# Patient Record
Sex: Male | Born: 1960
Health system: Southern US, Community
[De-identification: ages and names within clinical notes are randomized; demographics above are authoritative.]

## PROBLEM LIST (undated history)

## (undated) DIAGNOSIS — K219 Gastro-esophageal reflux disease without esophagitis: Secondary | ICD-10-CM

## (undated) DIAGNOSIS — I10 Essential (primary) hypertension: Secondary | ICD-10-CM

## (undated) DIAGNOSIS — E785 Hyperlipidemia, unspecified: Secondary | ICD-10-CM

## (undated) HISTORY — DX: Gastro-esophageal reflux disease without esophagitis: K21.9

## (undated) HISTORY — DX: Essential (primary) hypertension: I10

## (undated) HISTORY — DX: Hyperlipidemia, unspecified: E78.5

---

## 2003-12-27 ENCOUNTER — Ambulatory Visit: Payer: Self-pay | Admitting: Gastroenterology

## 2011-11-16 ENCOUNTER — Ambulatory Visit: Payer: Self-pay | Admitting: Family Medicine

## 2013-11-21 ENCOUNTER — Ambulatory Visit: Payer: Self-pay | Admitting: Family Medicine

## 2014-04-22 ENCOUNTER — Ambulatory Visit
Admit: 2014-04-22 | Disposition: A | Discharge status: Home / Self Care | Payer: Self-pay | Attending: Family Medicine | Admitting: Family Medicine

## 2014-09-09 ENCOUNTER — Encounter: Payer: Self-pay | Admitting: Family Medicine

## 2014-09-09 ENCOUNTER — Ambulatory Visit (INDEPENDENT_AMBULATORY_CARE_PROVIDER_SITE_OTHER): Payer: BLUE CROSS/BLUE SHIELD | Admitting: Family Medicine

## 2014-09-09 VITALS — BP 122/80 | HR 79 | Temp 97.5°F | Resp 19 | Ht 67.0 in | Wt 180.2 lb

## 2014-09-09 DIAGNOSIS — K219 Gastro-esophageal reflux disease without esophagitis: Secondary | ICD-10-CM | POA: Diagnosis not present

## 2014-09-09 DIAGNOSIS — I10 Essential (primary) hypertension: Secondary | ICD-10-CM | POA: Diagnosis not present

## 2014-09-09 DIAGNOSIS — E785 Hyperlipidemia, unspecified: Secondary | ICD-10-CM | POA: Diagnosis not present

## 2014-09-09 MED ORDER — CRESTOR 40 MG PO TABS: 40.0000 mg | ORAL_TABLET | Freq: Every day | ORAL | Status: DC

## 2014-09-09 MED ORDER — LANSOPRAZOLE 30 MG PO CPDR: 30.0000 mg | DELAYED_RELEASE_CAPSULE | Freq: Every day | ORAL | Status: DC

## 2014-09-09 MED ORDER — AMLODIPINE BESYLATE 2.5 MG PO TABS: 2.5000 mg | ORAL_TABLET | Freq: Every day | ORAL | Status: DC

## 2014-09-09 NOTE — Progress Notes (Signed)
Name: Martin Cook   MRN: [id number]    DOB: Apr 05, 1960   Date:09/09/2014       Progress Note  Subjective  Chief Complaint  Chief Complaint  Patient presents with  . Medication Refill    amlodipine 2.5mg  / crestor 40mg  / lansoprazole 30mg   . Gastrophageal Reflux  . Hyperlipidemia  . Hypertension    Gastrophageal Reflux He reports no abdominal pain, no chest pain, no early satiety, no heartburn or no nausea. He has tried a PPI for the symptoms. The treatment provided significant relief. Past procedures include an EGD. Past procedures do not include an abdominal ultrasound.  Hyperlipidemia The problem is controlled. Pertinent negatives include no chest pain, leg pain, myalgias or shortness of breath. Current antihyperlipidemic treatment includes statins.  Hypertension This is a chronic problem. The problem is controlled. Pertinent negatives include no chest pain, headaches, orthopnea, palpitations or shortness of breath. Past treatments include calcium channel blockers. There is no history of kidney disease, CAD/MI or CVA.      Past Medical History  Diagnosis Date  . Hyperlipidemia   . Hypertension   . GERD (gastroesophageal reflux disease)     History reviewed. No pertinent past surgical history.  History reviewed. No pertinent family history.  Social History   Social History  . Marital Status: Married    Spouse Name: N/A  . Number of Children: N/A  . Years of Education: N/A   Occupational History  . Not on file.   Social History Main Topics  . Smoking status: Current Every Day Smoker    Types: Cigarettes  . Smokeless tobacco: Never Used  . Alcohol Use: No     Comment: occasional  . Drug Use: No  . Sexual Activity: Not on file   Other Topics Concern  . Not on file   Social History Narrative  . No narrative on file     Current outpatient prescriptions:  .  amLODipine (NORVASC) 2.5 MG tablet, Take 2.5 mg by mouth daily., Disp: , Rfl: 2 .  CRESTOR 40 MG  tablet, Take 40 mg by mouth at bedtime., Disp: , Rfl: 0 .  lansoprazole (PREVACID) 30 MG capsule, Take 30 mg by mouth daily., Disp: , Rfl: 0  No Known Allergies   Review of Systems  Respiratory: Negative for shortness of breath.   Cardiovascular: Negative for chest pain, palpitations and orthopnea.  Gastrointestinal: Negative for heartburn, nausea and abdominal pain.  Musculoskeletal: Negative for myalgias.  Neurological: Negative for headaches.      Objective  Filed Vitals:   09/09/14 0927  BP: 122/80  Pulse: 79  Temp: 97.5 F (36.4 C)  TempSrc: Oral  Resp: 19  Height: 5\' 7"  (1.702 m)  Weight: 180 lb 3.2 oz (81.738 kg)  SpO2: 96%    Physical Exam  Constitutional: He is oriented to person, place, and time and well-developed, well-nourished, and in no distress.  Cardiovascular: Normal rate and regular rhythm.   Pulmonary/Chest: Effort normal and breath sounds normal.  Abdominal: Soft. Bowel sounds are normal.  Musculoskeletal: He exhibits no edema.  Neurological: He is alert and oriented to person, place, and time.  Skin: Skin is warm and dry.  Nursing note and vitals reviewed.   Assessment & Plan  1. Essential hypertension  - amLODipine (NORVASC) 2.5 MG tablet; Take 1 tablet (2.5 mg total) by mouth daily.  Dispense: 90 tablet; Refill: 1  2. Gastroesophageal reflux disease, esophagitis presence not specified  - lansoprazole (PREVACID) 30 MG capsule; Take  1 capsule (30 mg total) by mouth daily.  Dispense: 90 capsule; Refill: 1  3. Hyperlipidemia  - CRESTOR 40 MG tablet; Take 1 tablet (40 mg total) by mouth at bedtime.  Dispense: 90 tablet; Refill: 1   Martin Cook [facility] 09/09/2014 9:58 AM

## 2014-12-10 ENCOUNTER — Encounter: Payer: Self-pay | Admitting: Family Medicine

## 2014-12-10 ENCOUNTER — Ambulatory Visit (INDEPENDENT_AMBULATORY_CARE_PROVIDER_SITE_OTHER): Payer: BLUE CROSS/BLUE SHIELD | Admitting: Family Medicine

## 2014-12-10 VITALS — BP 122/80 | HR 84 | Temp 98.1°F | Resp 17 | Ht 67.0 in | Wt 186.9 lb

## 2014-12-10 DIAGNOSIS — Z720 Tobacco use: Secondary | ICD-10-CM | POA: Insufficient documentation

## 2014-12-10 DIAGNOSIS — K219 Gastro-esophageal reflux disease without esophagitis: Secondary | ICD-10-CM | POA: Diagnosis not present

## 2014-12-10 DIAGNOSIS — E785 Hyperlipidemia, unspecified: Secondary | ICD-10-CM | POA: Diagnosis not present

## 2014-12-10 DIAGNOSIS — I10 Essential (primary) hypertension: Secondary | ICD-10-CM | POA: Diagnosis not present

## 2014-12-10 MED ORDER — CRESTOR 40 MG PO TABS: 40.0000 mg | ORAL_TABLET | Freq: Every day | ORAL | Status: DC

## 2014-12-10 MED ORDER — LANSOPRAZOLE 30 MG PO CPDR: 30.0000 mg | DELAYED_RELEASE_CAPSULE | Freq: Every day | ORAL | Status: DC

## 2014-12-10 MED ORDER — AMLODIPINE BESYLATE 2.5 MG PO TABS: 2.5000 mg | ORAL_TABLET | Freq: Every day | ORAL | Status: DC

## 2014-12-10 NOTE — Progress Notes (Signed)
Name: Martin Cook   MRN: [id number]    DOB: [birth year]   Date:12/10/2014       Progress Note  Subjective  Chief Complaint  Chief Complaint  Patient presents with  . Follow-up    3 mo  . Labs Only    fasting  . Hypertension  . Hyperlipidemia  . Gastroesophageal Reflux    Hypertension This is a chronic problem. The problem is controlled. Pertinent negatives include no chest pain, headaches, palpitations or shortness of breath. Past treatments include calcium channel blockers.  Hyperlipidemia This is a chronic problem. The problem is controlled. Pertinent negatives include no chest pain, leg pain, myalgias or shortness of breath. Current antihyperlipidemic treatment includes statins.  Gastroesophageal Reflux He reports no chest pain, no dysphagia, no heartburn, no nausea or no sore throat. This is a chronic problem. The problem has been unchanged. The symptoms are aggravated by caffeine. He has tried a PPI for the symptoms.    Past Medical History  Diagnosis Date  . Hyperlipidemia   . Hypertension   . GERD (gastroesophageal reflux disease)     History reviewed. No pertinent past surgical history.  History reviewed. No pertinent family history.  Social History   Social History  . Marital Status: Married    Spouse Name: N/A  . Number of Children: N/A  . Years of Education: N/A   Occupational History  . Not on file.   Social History Main Topics  . Smoking status: Current Every Day Smoker    Types: Cigarettes  . Smokeless tobacco: Never Used  . Alcohol Use: No     Comment: occasional  . Drug Use: No  . Sexual Activity: Not on file   Other Topics Concern  . Not on file   Social History Narrative     Current outpatient prescriptions:  .  amLODipine (NORVASC) 2.5 MG tablet, Take 1 tablet (2.5 mg total) by mouth daily., Disp: 90 tablet, Rfl: 1 .  CRESTOR 40 MG tablet, Take 1 tablet (40 mg total) by mouth at bedtime., Disp: 90 tablet, Rfl: 1 .  lansoprazole  (PREVACID) 30 MG capsule, Take 1 capsule (30 mg total) by mouth daily., Disp: 90 capsule, Rfl: 1  No Known Allergies   Review of Systems  HENT: Negative for sore throat.   Respiratory: Negative for shortness of breath.   Cardiovascular: Negative for chest pain and palpitations.  Gastrointestinal: Negative for heartburn, dysphagia and nausea.  Musculoskeletal: Negative for myalgias.  Neurological: Negative for headaches.     Objective  Filed Vitals:   12/10/14 0816  BP: 122/80  Pulse: 84  Temp: 98.1 F (36.7 C)  TempSrc: Oral  Resp: 17  Height: 5\' 7"  (1.702 m)  Weight: 186 lb 14.4 oz (84.777 kg)  SpO2: 97%    Physical Exam  Constitutional: He is oriented to person, place, and time and well-developed, well-nourished, and in no distress.  HENT:  Head: Normocephalic and atraumatic.  Eyes: Pupils are equal, round, and reactive to light.  Cardiovascular: Normal rate, regular rhythm and normal heart sounds.   No murmur heard. Pulmonary/Chest: Effort normal and breath sounds normal. He has no wheezes.  Abdominal: Soft. Bowel sounds are normal.  Musculoskeletal: Normal range of motion.  Neurological: He is alert and oriented to person, place, and time.  Skin: Skin is warm and dry.  Psychiatric: Mood, memory, affect and judgment normal.  Nursing note and vitals reviewed.   Assessment & Plan  1. Essential hypertension BP is stable on  current therapy - amLODipine (NORVASC) 2.5 MG tablet; Take 1 tablet (2.5 mg total) by mouth daily.  Dispense: 90 tablet; Refill: 1  2. Gastroesophageal reflux disease, esophagitis presence not specified Symptoms of heartburn and reflux controlled on current PPI therapy. - lansoprazole (PREVACID) 30 MG capsule; Take 1 capsule (30 mg total) by mouth daily.  Dispense: 90 capsule; Refill: 1  3. Hyperlipidemia Recheck FLP today and follow-up - CRESTOR 40 MG tablet; Take 1 tablet (40 mg total) by mouth at bedtime.  Dispense: 90 tablet; Refill:  1 - Lipid Profile - Comprehensive Metabolic Panel (CMET)   Eyal Greenhaw [facility] 12/10/2014 8:27 AM

## 2014-12-11 LAB — COMPREHENSIVE METABOLIC PANEL
ALBUMIN: 4.5 g/dL (ref 3.5–5.5)
ALK PHOS: 78 IU/L (ref 39–117)
ALT: 21 IU/L (ref 0–44)
AST: 20 IU/L (ref 0–40)
Albumin/Globulin Ratio: 2 (ref 1.1–2.5)
BILIRUBIN TOTAL: 0.9 mg/dL (ref 0.0–1.2)
BUN / CREAT RATIO: 11 (ref 9–20)
BUN: 10 mg/dL (ref 6–24)
CO2: 24 mmol/L (ref 18–29)
CREATININE: 0.9 mg/dL (ref 0.76–1.27)
Calcium: 9.7 mg/dL (ref 8.7–10.2)
Chloride: 100 mmol/L (ref 97–106)
GFR calc non Af Amer: 96 mL/min/{1.73_m2} (ref >59)
GFR, EST AFRICAN AMERICAN: 111 mL/min/{1.73_m2} (ref >59)
GLUCOSE: 99 mg/dL (ref 65–99)
Globulin, Total: 2.3 g/dL (ref 1.5–4.5)
Potassium: 4.3 mmol/L (ref 3.5–5.2)
Sodium: 142 mmol/L (ref 136–144)
Total Protein: 6.8 g/dL (ref 6.0–8.5)

## 2014-12-11 LAB — LIPID PANEL
CHOL/HDL RATIO: 2.8 ratio (ref 0.0–5.0)
Cholesterol, Total: 182 mg/dL (ref 100–199)
HDL: 64 mg/dL (ref >39)
LDL Calculated: 74 mg/dL (ref 0–99)
Triglycerides: 219 mg/dL — ABNORMAL HIGH (ref 0–149)
VLDL CHOLESTEROL CAL: 44 mg/dL — AB (ref 5–40)

## 2015-03-06 ENCOUNTER — Other Ambulatory Visit: Payer: Self-pay | Admitting: Family Medicine

## 2015-06-04 ENCOUNTER — Other Ambulatory Visit: Payer: Self-pay | Admitting: Family Medicine

## 2015-06-09 ENCOUNTER — Ambulatory Visit (INDEPENDENT_AMBULATORY_CARE_PROVIDER_SITE_OTHER): Payer: BLUE CROSS/BLUE SHIELD | Admitting: Family Medicine

## 2015-06-09 ENCOUNTER — Encounter: Payer: Self-pay | Admitting: Family Medicine

## 2015-06-09 VITALS — BP 120/93 | HR 80 | Temp 98.0°F | Resp 16 | Ht 67.0 in | Wt 189.8 lb

## 2015-06-09 DIAGNOSIS — K219 Gastro-esophageal reflux disease without esophagitis: Secondary | ICD-10-CM

## 2015-06-09 DIAGNOSIS — I10 Essential (primary) hypertension: Secondary | ICD-10-CM | POA: Diagnosis not present

## 2015-06-09 DIAGNOSIS — E785 Hyperlipidemia, unspecified: Secondary | ICD-10-CM | POA: Diagnosis not present

## 2015-06-09 MED ORDER — LANSOPRAZOLE 30 MG PO CPDR: 30.0000 mg | DELAYED_RELEASE_CAPSULE | Freq: Every day | ORAL | Status: DC

## 2015-06-09 NOTE — Progress Notes (Signed)
Name: Martin Cook   MRN: [id number]    DOB: Jan 22, 1960   Date:06/09/2015       Progress Note  Subjective  Chief Complaint  Chief Complaint  Patient presents with  . Follow-up    6 mo  . Medication Refill    lansoprazole 30 mg     Hypertension This is a chronic problem. The problem is unchanged. The problem is controlled. Associated symptoms include sweats (sometimes wakes up sweating.). Pertinent negatives include no blurred vision, chest pain, headaches, palpitations or shortness of breath. Past treatments include calcium channel blockers.  Hyperlipidemia This is a chronic problem. Recent lipid tests were reviewed and are high. Pertinent negatives include no chest pain, myalgias or shortness of breath. Current antihyperlipidemic treatment includes statins.  Gastroesophageal Reflux He reports no abdominal pain, no chest pain, no heartburn or no nausea. This is a chronic problem. The symptoms are aggravated by certain foods (chocolate, sodas, etc.). He has tried a PPI for the symptoms.     Past Medical History  Diagnosis Date  . Hyperlipidemia   . Hypertension   . GERD (gastroesophageal reflux disease)     History reviewed. No pertinent past surgical history.  History reviewed. No pertinent family history.  Social History   Social History  . Marital Status: Married    Spouse Name: N/A  . Number of Children: N/A  . Years of Education: N/A   Occupational History  . Not on file.   Social History Main Topics  . Smoking status: Current Every Day Smoker    Types: Cigarettes  . Smokeless tobacco: Never Used  . Alcohol Use: No     Comment: occasional  . Drug Use: No  . Sexual Activity: Not on file   Other Topics Concern  . Not on file   Social History Narrative     Current outpatient prescriptions:  .  amLODipine (NORVASC) 2.5 MG tablet, TAKE 1 TABLET (2.5 MG TOTAL) BY MOUTH DAILY., Disp: 90 tablet, Rfl: 1 .  CRESTOR 40 MG tablet, Take 1 tablet (40 mg total) by  mouth at bedtime., Disp: 90 tablet, Rfl: 1 .  CRESTOR 40 MG tablet, TAKE 1 TABLET (40 MG TOTAL) BY MOUTH AT BEDTIME., Disp: 90 tablet, Rfl: 1 .  lansoprazole (PREVACID) 30 MG capsule, Take 1 capsule (30 mg total) by mouth daily., Disp: 90 capsule, Rfl: 1  No Known Allergies   Review of Systems  Constitutional: Negative for fever and chills.  Eyes: Negative for blurred vision.  Respiratory: Negative for shortness of breath.   Cardiovascular: Negative for chest pain and palpitations.  Gastrointestinal: Negative for heartburn, nausea, vomiting and abdominal pain.  Musculoskeletal: Negative for myalgias.  Neurological: Negative for headaches.    Objective  Filed Vitals:   06/09/15 0809  BP: 120/93  Pulse: 80  Temp: 98 F (36.7 C)  TempSrc: Oral  Resp: 16  Height: 5\' 7"  (1.702 m)  Weight: 189 lb 12.8 oz (86.093 kg)  SpO2: 96%    Physical Exam  Constitutional: He is oriented to person, place, and time and well-developed, well-nourished, and in no distress.  HENT:  Head: Normocephalic and atraumatic.  Eyes: Pupils are equal, round, and reactive to light.  Cardiovascular: Normal rate and regular rhythm.   Pulmonary/Chest: Effort normal and breath sounds normal.  Abdominal: Soft. Bowel sounds are normal.  Neurological: He is alert and oriented to person, place, and time.  Psychiatric: Mood, memory, affect and judgment normal.  Nursing note and vitals reviewed.  Assessment & Plan  1. Gastroesophageal reflux disease, esophagitis presence not specified Stable on PPI - lansoprazole (PREVACID) 30 MG capsule; Take 1 capsule (30 mg total) by mouth daily.  Dispense: 90 capsule; Refill: 1  2. Hyperlipidemia Elevated triglycerides, repeat FLP and consider triglyceride lowering therapy - Lipid Profile - Comprehensive Metabolic Panel (CMET)  3. Essential hypertension D BP elevated, patient reports BP usually higher when coming in for doctor's appointments. Continue on amlodipine  and follow-up.   Jovana Rembold [facility] 06/09/2015 8:24 AM

## 2015-06-10 LAB — COMPREHENSIVE METABOLIC PANEL
ALK PHOS: 80 IU/L (ref 39–117)
ALT: 24 IU/L (ref 0–44)
AST: 23 IU/L (ref 0–40)
Albumin/Globulin Ratio: 2.2 (ref 1.2–2.2)
Albumin: 4.6 g/dL (ref 3.5–5.5)
BILIRUBIN TOTAL: 0.8 mg/dL (ref 0.0–1.2)
BUN/Creatinine Ratio: 9 (ref 9–20)
BUN: 9 mg/dL (ref 6–24)
CHLORIDE: 99 mmol/L (ref 96–106)
CO2: 26 mmol/L (ref 18–29)
Calcium: 9.6 mg/dL (ref 8.7–10.2)
Creatinine, Ser: 0.97 mg/dL (ref 0.76–1.27)
GFR calc Af Amer: 101 mL/min/{1.73_m2} (ref >59)
GFR calc non Af Amer: 88 mL/min/{1.73_m2} (ref >59)
GLUCOSE: 106 mg/dL — AB (ref 65–99)
Globulin, Total: 2.1 g/dL (ref 1.5–4.5)
Potassium: 4.8 mmol/L (ref 3.5–5.2)
Sodium: 141 mmol/L (ref 134–144)
Total Protein: 6.7 g/dL (ref 6.0–8.5)

## 2015-06-10 LAB — LIPID PANEL
CHOL/HDL RATIO: 3.2 ratio (ref 0.0–5.0)
Cholesterol, Total: 185 mg/dL (ref 100–199)
HDL: 57 mg/dL (ref >39)
LDL CALC: 84 mg/dL (ref 0–99)
Triglycerides: 218 mg/dL — ABNORMAL HIGH (ref 0–149)
VLDL CHOLESTEROL CAL: 44 mg/dL — AB (ref 5–40)

## 2015-12-02 ENCOUNTER — Telehealth: Payer: Self-pay | Admitting: Family Medicine

## 2015-12-02 ENCOUNTER — Other Ambulatory Visit: Payer: Self-pay | Admitting: Family Medicine

## 2015-12-02 DIAGNOSIS — I10 Essential (primary) hypertension: Secondary | ICD-10-CM

## 2015-12-02 DIAGNOSIS — K219 Gastro-esophageal reflux disease without esophagitis: Secondary | ICD-10-CM

## 2015-12-02 MED ORDER — AMLODIPINE BESYLATE 2.5 MG PO TABS: ORAL_TABLET | ORAL | 0 refills | Status: DC

## 2015-12-02 NOTE — Telephone Encounter (Signed)
Pt [facility]. Pt has an appt on 12/15/15.

## 2015-12-02 NOTE — Telephone Encounter (Signed)
Prescription for amlodipine has been sent to patient's pharmacy

## 2015-12-03 NOTE — Telephone Encounter (Signed)
Pt informed

## 2015-12-15 ENCOUNTER — Ambulatory Visit (INDEPENDENT_AMBULATORY_CARE_PROVIDER_SITE_OTHER): Payer: BLUE CROSS/BLUE SHIELD | Admitting: Family Medicine

## 2015-12-15 ENCOUNTER — Encounter: Payer: Self-pay | Admitting: Family Medicine

## 2015-12-15 DIAGNOSIS — E785 Hyperlipidemia, unspecified: Secondary | ICD-10-CM

## 2015-12-15 DIAGNOSIS — R739 Hyperglycemia, unspecified: Secondary | ICD-10-CM

## 2015-12-15 DIAGNOSIS — K219 Gastro-esophageal reflux disease without esophagitis: Secondary | ICD-10-CM

## 2015-12-15 DIAGNOSIS — I10 Essential (primary) hypertension: Secondary | ICD-10-CM

## 2015-12-15 LAB — COMPLETE METABOLIC PANEL WITH GFR
ALT: 30 U/L (ref 9–46)
AST: 30 U/L (ref 10–35)
Albumin: 4.7 g/dL (ref 3.6–5.1)
Alkaline Phosphatase: 72 U/L (ref 40–115)
BUN: 12 mg/dL (ref 7–25)
CALCIUM: 9.5 mg/dL (ref 8.6–10.3)
CHLORIDE: 105 mmol/L (ref 98–110)
CO2: 30 mmol/L (ref 20–31)
CREATININE: 0.88 mg/dL (ref 0.70–1.33)
Glucose, Bld: 103 mg/dL — ABNORMAL HIGH (ref 65–99)
Potassium: 4.4 mmol/L (ref 3.5–5.3)
Sodium: 143 mmol/L (ref 135–146)
Total Bilirubin: 1.1 mg/dL (ref 0.2–1.2)
Total Protein: 6.9 g/dL (ref 6.1–8.1)

## 2015-12-15 LAB — LIPID PANEL
CHOL/HDL RATIO: 3 ratio (ref <5.0)
CHOLESTEROL: 203 mg/dL — AB (ref <200)
HDL: 67 mg/dL (ref >40)
LDL CALC: 87 mg/dL (ref <100)
TRIGLYCERIDES: 246 mg/dL — AB (ref <150)
VLDL: 49 mg/dL — AB (ref <30)

## 2015-12-15 LAB — POCT GLYCOSYLATED HEMOGLOBIN (HGB A1C): HEMOGLOBIN A1C: 6.1

## 2015-12-15 MED ORDER — LANSOPRAZOLE 30 MG PO CPDR: 30.0000 mg | DELAYED_RELEASE_CAPSULE | Freq: Every day | ORAL | 1 refills | Status: DC

## 2015-12-15 MED ORDER — AMLODIPINE BESYLATE 2.5 MG PO TABS: ORAL_TABLET | ORAL | 0 refills | Status: DC

## 2015-12-15 MED ORDER — CRESTOR 40 MG PO TABS: 40.0000 mg | ORAL_TABLET | Freq: Every day | ORAL | 1 refills | Status: DC

## 2015-12-15 NOTE — Progress Notes (Signed)
Name: Martin Cook   MRN: [id number]    DOB: 03/18/1960   Date:12/15/2015       Progress Note  Subjective  Chief Complaint  Chief Complaint  Patient presents with  . Follow-up    6 mo  . Medication Refill    Hypertension  This is a chronic problem. The problem is unchanged. The problem is controlled. Pertinent negatives include no blurred vision, chest pain, headaches, palpitations, shortness of breath or sweats. Past treatments include calcium channel blockers.  Hyperlipidemia  This is a chronic problem. The problem is uncontrolled. Recent lipid tests were reviewed and are high (elevated triglycerides). Pertinent negatives include no chest pain, myalgias or shortness of breath. Current antihyperlipidemic treatment includes statins.  Gastroesophageal Reflux  He reports no abdominal pain, no chest pain, no heartburn or no nausea. This is a chronic problem. The symptoms are aggravated by certain foods (chocolate, sodas, etc.). He has tried a PPI for the symptoms.     Past Medical History:  Diagnosis Date  . GERD (gastroesophageal reflux disease)   . Hyperlipidemia   . Hypertension     History reviewed. No pertinent surgical history.  History reviewed. No pertinent family history.  Social History   Social History  . Marital status: Married    Spouse name: N/A  . Number of children: N/A  . Years of education: N/A   Occupational History  . Not on file.   Social History Main Topics  . Smoking status: Current Every Day Smoker    Types: Cigarettes  . Smokeless tobacco: Never Used  . Alcohol use No     Comment: occasional  . Drug use: No  . Sexual activity: Not on file   Other Topics Concern  . Not on file   Social History Narrative  . No narrative on file     Current Outpatient Prescriptions:  .  amLODipine (NORVASC) 2.5 MG tablet, TAKE 1 TABLET (2.5 MG TOTAL) BY MOUTH DAILY., Disp: 90 tablet, Rfl: 0 .  CRESTOR 40 MG tablet, Take 1 tablet (40 mg total) by  mouth at bedtime., Disp: 90 tablet, Rfl: 1 .  lansoprazole (PREVACID) 30 MG capsule, Take 1 capsule (30 mg total) by mouth daily., Disp: 90 capsule, Rfl: 1  No Known Allergies   Review of Systems  Eyes: Negative for blurred vision.  Respiratory: Negative for shortness of breath.   Cardiovascular: Negative for chest pain and palpitations.  Gastrointestinal: Negative for abdominal pain, heartburn and nausea.  Musculoskeletal: Negative for myalgias.  Neurological: Negative for headaches.      Objective  Vitals:   12/15/15 0835  BP: 123/77  Pulse: 83  Resp: 17  Temp: 97.9 F (36.6 C)  TempSrc: Oral  SpO2: 96%  Weight: 191 lb 4.8 oz (86.8 kg)  Height: 5\' 7"  (1.702 m)    Physical Exam  Constitutional: He is oriented to person, place, and time and well-developed, well-nourished, and in no distress.  HENT:  Head: Normocephalic and atraumatic.  Eyes: Pupils are equal, round, and reactive to light.  Cardiovascular: Normal rate, regular rhythm and normal heart sounds.   No murmur heard. Pulmonary/Chest: Effort normal and breath sounds normal. He has no wheezes.  Abdominal: Soft. Bowel sounds are normal. There is no tenderness.  Neurological: He is alert and oriented to person, place, and time.  Psychiatric: Mood, memory, affect and judgment normal.  Nursing note and vitals reviewed.      No results found for this or any previous visit (from  the past 2160 hour(s)).   Assessment & Plan  1. Essential hypertension  - amLODipine (NORVASC) 2.5 MG tablet; TAKE 1 TABLET (2.5 MG TOTAL) BY MOUTH DAILY.  Dispense: 90 tablet; Refill: 0  2. Gastroesophageal reflux disease, esophagitis presence not specified  - lansoprazole (PREVACID) 30 MG capsule; Take 1 capsule (30 mg total) by mouth daily.  Dispense: 90 capsule; Refill: 1  3. Hyperlipidemia, unspecified hyperlipidemia type  - CRESTOR 40 MG tablet; Take 1 tablet (40 mg total) by mouth at bedtime.  Dispense: 90 tablet;  Refill: 1 - Lipid Profile - COMPLETE METABOLIC PANEL WITH GFR  4. Hyperglycemia A1c 6.1%, consistent with prediabetes - POCT HgB A1C   Mehki Klumpp Asad A. [facility]lene [facility] 12/15/2015 8:42 AM

## 2016-02-06 ENCOUNTER — Ambulatory Visit (INDEPENDENT_AMBULATORY_CARE_PROVIDER_SITE_OTHER): Payer: BLUE CROSS/BLUE SHIELD | Admitting: Family Medicine

## 2016-02-06 ENCOUNTER — Encounter: Payer: BLUE CROSS/BLUE SHIELD | Admitting: Family Medicine

## 2016-02-06 ENCOUNTER — Encounter: Payer: Self-pay | Admitting: Family Medicine

## 2016-02-06 DIAGNOSIS — Z1211 Encounter for screening for malignant neoplasm of colon: Secondary | ICD-10-CM

## 2016-02-06 DIAGNOSIS — Z0000 Encounter for general adult medical examination without abnormal findings: Secondary | ICD-10-CM

## 2016-02-06 LAB — PSA: PSA: 0.3 ng/mL (ref <=4.0)

## 2016-02-06 LAB — CBC WITH DIFFERENTIAL/PLATELET
BASOS ABS: 55 {cells}/uL (ref 0–200)
Basophils Relative: 1 %
Eosinophils Absolute: 55 cells/uL (ref 15–500)
Eosinophils Relative: 1 %
HEMATOCRIT: 46.7 % (ref 38.5–50.0)
HEMOGLOBIN: 15.8 g/dL (ref 13.2–17.1)
LYMPHS ABS: 1595 {cells}/uL (ref 850–3900)
Lymphocytes Relative: 29 %
MCH: 30.3 pg (ref 27.0–33.0)
MCHC: 33.8 g/dL (ref 32.0–36.0)
MCV: 89.6 fL (ref 80.0–100.0)
MONO ABS: 440 {cells}/uL (ref 200–950)
MPV: 9.8 fL (ref 7.5–12.5)
Monocytes Relative: 8 %
NEUTROS PCT: 61 %
Neutro Abs: 3355 cells/uL (ref 1500–7800)
Platelets: 241 10*3/uL (ref 140–400)
RBC: 5.21 MIL/uL (ref 4.20–5.80)
RDW: 14.6 % (ref 11.0–15.0)
WBC: 5.5 10*3/uL (ref 3.8–10.8)

## 2016-02-06 LAB — TSH: TSH: 1.31 mIU/L (ref 0.40–4.50)

## 2016-02-06 NOTE — Progress Notes (Signed)
Name: Martin Cook   MRN: [id number]    DOB: 1960-03-26   Date:02/06/2016       Progress Note  Subjective  Chief Complaint  Chief Complaint  Patient presents with  . Annual Exam    CPE    HPI  Pt. Presents for a Complete Physical Exam He never had a colonoscopy, does not want to have one. Had a prostate exam by last PCP, no PSA on file.   Past Medical History:  Diagnosis Date  . GERD (gastroesophageal reflux disease)   . Hyperlipidemia   . Hypertension     History reviewed. No pertinent surgical history.  Family History  Problem Relation Age of Onset  . Stroke Mother   . Stroke Father   . Deep vein thrombosis Father   . Cancer Brother     Social History   Social History  . Marital status: Married    Spouse name: N/A  . Number of children: N/A  . Years of education: N/A   Occupational History  . Not on file.   Social History Main Topics  . Smoking status: Current Every Day Smoker    Packs/day: 1.00    Types: Cigarettes  . Smokeless tobacco: Never Used  . Alcohol use No     Comment: occasional  . Drug use: No  . Sexual activity: Yes   Other Topics Concern  . Not on file   Social History Narrative  . No narrative on file     Current Outpatient Prescriptions:  .  amLODipine (NORVASC) 2.5 MG tablet, TAKE 1 TABLET (2.5 MG TOTAL) BY MOUTH DAILY., Disp: 90 tablet, Rfl: 0 .  CRESTOR 40 MG tablet, Take 1 tablet (40 mg total) by mouth at bedtime., Disp: 90 tablet, Rfl: 1 .  lansoprazole (PREVACID) 30 MG capsule, Take 1 capsule (30 mg total) by mouth daily., Disp: 90 capsule, Rfl: 1  No Known Allergies   Review of Systems  Constitutional: Negative for chills, fever, malaise/fatigue and weight loss.  HENT: Positive for congestion (sinus congestion from gas heating) and sinus pain. Negative for sore throat.   Eyes: Negative for blurred vision and double vision.  Respiratory: Positive for cough. Negative for sputum production and shortness of breath.    Cardiovascular: Negative for chest pain and leg swelling.  Gastrointestinal: Negative for abdominal pain, blood in stool, nausea and vomiting.  Genitourinary: Negative for dysuria and hematuria.  Musculoskeletal: Negative for back pain (occasional low back pain), joint pain and neck pain.  Skin: Negative for rash.  Neurological: Negative for dizziness and headaches (occasional mild headache).  Psychiatric/Behavioral: Negative for depression and hallucinations. The patient is nervous/anxious. The patient does not have insomnia.     Objective  Vitals:   02/06/16 1147  BP: 130/75  Pulse: 82  Resp: 16  Temp: 97.9 F (36.6 C)  TempSrc: Oral  SpO2: 96%  Weight: 191 lb 11.2 oz (87 kg)  Height: 5\' 7"  (1.702 m)    Physical Exam  Constitutional: He is oriented to person, place, and time and well-developed, well-nourished, and in no distress.  HENT:  Head: Normocephalic and atraumatic.  Right Ear: External ear normal.  Left Ear: External ear normal.  Mouth/Throat: No oropharyngeal exudate.  Eyes: Conjunctivae are normal. Pupils are equal, round, and reactive to light.  Neck: Neck supple.  Cardiovascular: Normal rate, regular rhythm and normal heart sounds.   No murmur heard. Pulmonary/Chest: Effort normal and breath sounds normal. He has no wheezes.  Abdominal: Soft. Bowel  sounds are normal. There is no tenderness.  Musculoskeletal: Normal range of motion. He exhibits no edema.  Neurological: He is alert and oriented to person, place, and time.  Psychiatric: Mood, memory, affect and judgment normal.  Nursing note and vitals reviewed.       Assessment & Plan  1. Annual physical exam Obtain age-appropriate lab work. - CBC with Differential - TSH - Vitamin D (25 hydroxy) - PSA  2. Colon cancer screening  - Cologuard   Martin Cook Asad A. Faylene [facility] 02/06/2016 12:27 PM

## 2016-02-07 LAB — VITAMIN D 25 HYDROXY (VIT D DEFICIENCY, FRACTURES): Vit D, 25-Hydroxy: 13 ng/mL — ABNORMAL LOW (ref 30–100)

## 2016-02-11 ENCOUNTER — Telehealth: Payer: Self-pay

## 2016-02-11 MED ORDER — VITAMIN D (ERGOCALCIFEROL) 1.25 MG (50000 UNIT) PO CAPS: 50000.0000 [IU] | ORAL_CAPSULE | ORAL | 0 refills | Status: DC

## 2016-02-11 NOTE — Telephone Encounter (Signed)
Patient has been notified of lab results and a prescription for vitamin D3 50,000 units take 1 capsule once a week x12 weeks has been sent to [facility] per Dr. Shah, patient has been notified  

## 2016-03-01 ENCOUNTER — Other Ambulatory Visit: Payer: Self-pay | Admitting: Family Medicine

## 2016-03-01 DIAGNOSIS — E785 Hyperlipidemia, unspecified: Secondary | ICD-10-CM

## 2016-03-03 ENCOUNTER — Telehealth: Payer: Self-pay | Admitting: Family Medicine

## 2016-03-03 DIAGNOSIS — E7800 Pure hypercholesterolemia, unspecified: Secondary | ICD-10-CM

## 2016-03-03 DIAGNOSIS — K219 Gastro-esophageal reflux disease without esophagitis: Secondary | ICD-10-CM

## 2016-03-03 NOTE — Telephone Encounter (Signed)
Pt would like his Crestor and Lansoprazole to be changed to a Tier 1. He states the ones he is currently is to expensive.  Tier 1 for Crestor are: Simvastatin 40 mg, Pravastain 40 mg, Atorvastatin, Rosuoastin. Tier 1 for Lansoprazole are Omeprazole, Ranitidine, Pantoprazole. [facility] advise[facility]

## 2016-03-04 MED ORDER — ROSUVASTATIN CALCIUM 40 MG PO TABS: 40.0000 mg | ORAL_TABLET | Freq: Every day | ORAL | 0 refills | Status: DC

## 2016-03-04 MED ORDER — PANTOPRAZOLE SODIUM 20 MG PO TBEC: 20.0000 mg | DELAYED_RELEASE_TABLET | Freq: Every day | ORAL | 0 refills | Status: DC

## 2016-03-04 NOTE — Telephone Encounter (Signed)
Prescription for Rosuvastatin 40 mg and Pantoprazole 20 mg are sent to patient's pharmacy

## 2016-03-04 NOTE — Telephone Encounter (Signed)
LMOM to notify pt

## 2016-04-05 ENCOUNTER — Telehealth: Payer: Self-pay | Admitting: Emergency Medicine

## 2016-04-05 NOTE — Telephone Encounter (Signed)
Please schedule patient for an appointment to discuss his symptoms and consider starting on alternative antihypertensive medication

## 2016-04-05 NOTE — Telephone Encounter (Signed)
Appointment made for 04/07/2016 to discuss BP meds

## 2016-04-05 NOTE — Telephone Encounter (Signed)
Was given Amlodipine 2.5 mg. This is not working for him. He has no appettite, SOB. Would like it changed to something else

## 2016-04-07 ENCOUNTER — Ambulatory Visit
Admission: RE | Admit: 2016-04-07 | Discharge: 2016-04-07 | Disposition: A | Discharge status: Home / Self Care | Payer: BLUE CROSS/BLUE SHIELD | Source: Ambulatory Visit | Attending: Family Medicine | Admitting: Family Medicine

## 2016-04-07 ENCOUNTER — Ambulatory Visit (INDEPENDENT_AMBULATORY_CARE_PROVIDER_SITE_OTHER): Payer: BLUE CROSS/BLUE SHIELD | Admitting: Family Medicine

## 2016-04-07 ENCOUNTER — Encounter: Payer: Self-pay | Admitting: Family Medicine

## 2016-04-07 DIAGNOSIS — R05 Cough: Secondary | ICD-10-CM | POA: Diagnosis not present

## 2016-04-07 DIAGNOSIS — R062 Wheezing: Secondary | ICD-10-CM | POA: Insufficient documentation

## 2016-04-07 DIAGNOSIS — R059 Cough, unspecified: Secondary | ICD-10-CM | POA: Insufficient documentation

## 2016-04-07 MED ORDER — ALBUTEROL SULFATE HFA 108 (90 BASE) MCG/ACT IN AERS: 2.0000 | INHALATION_SPRAY | Freq: Four times a day (QID) | RESPIRATORY_TRACT | 2 refills | Status: DC | PRN

## 2016-04-07 MED ORDER — AZITHROMYCIN 250 MG PO TABS: ORAL_TABLET | ORAL | 0 refills | Status: DC

## 2016-04-07 NOTE — Progress Notes (Signed)
Name: Martin Cook   MRN: [id number]    DOB: Jun 05, 1960   Date:04/07/2016       Progress Note  Subjective  Chief Complaint  Chief Complaint  Patient presents with  . Medication Problem    Cough  This is a new problem. The current episode started in the past 7 days (4 days ago). The cough is productive of sputum. Pertinent negatives include no chills, ear pain, fever, headaches, hemoptysis, nasal congestion, sore throat or wheezing. 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 Risk factors for lung disease include occupational exposure. He has tried nothing for the symptoms. There is no history of asthma, bronchitis, COPD or pneumonia.     Past Medical History:  Diagnosis Date  . GERD (gastroesophageal reflux disease)   . Hyperlipidemia   . Hypertension     History reviewed. No pertinent surgical history.  Family History  Problem Relation Age of Onset  . Stroke Mother   . Stroke Father   . Deep vein thrombosis Father   . Cancer Brother     Social History   Social History  . Marital status: Married    Spouse name: N/A  . Number of children: N/A  . Years of education: N/A   Occupational History  . Not on file.   Social History Main Topics  . Smoking status: Current Every Day Smoker    Packs/day: 1.00    Types: Cigarettes  . Smokeless tobacco: Never Used  . Alcohol use No     Comment: occasional  . Drug use: No  . Sexual activity: Yes   Other Topics Concern  . Not on file   Social History Narrative  . No narrative on file     Current Outpatient Prescriptions:  .  amLODipine (NORVASC) 2.5 MG tablet, TAKE 1 TABLET (2.5 MG TOTAL) BY MOUTH DAILY., Disp: 90 tablet, Rfl: 0 .  pantoprazole (PROTONIX) 20 MG tablet, Take 1 tablet (20 mg total) by mouth daily., Disp: 90 tablet, Rfl: 0 .  rosuvastatin (CRESTOR) 40 MG tablet, Take 1 tablet (40 mg total) by mouth daily., Disp: 90 tablet, Rfl:  0 .  Vitamin D, Ergocalciferol, (DRISDOL) 50000 units CAPS capsule, Take 1 capsule (50,000 Units total) by mouth once a week. For 12 weeks, Disp: 12 capsule, Rfl: 0  No Known Allergies   Review of Systems  Constitutional: Negative for chills and fever.  HENT: Negative for ear pain and sore throat.   Respiratory: Positive for cough. Negative for hemoptysis and wheezing.   Neurological: Negative for headaches.      Objective  Vitals:   04/07/16 1048  BP: 132/79  Pulse: 83  Resp: 16  Temp: 97.5 F (36.4 C)  TempSrc: Oral  SpO2: 93%  Weight: 187 lb (84.8 kg)  Height: 5\' 7"  (1.702 m)    Physical Exam  Constitutional: He is well-developed, well-nourished, and in no distress.  HENT:  Head: Normocephalic and atraumatic.  Right Ear: Tympanic membrane and ear canal normal.  Left Ear: Tympanic membrane and ear canal normal.  Nose: Right sinus exhibits no maxillary sinus tenderness and no frontal sinus tenderness. Left sinus exhibits no maxillary sinus tenderness and no frontal sinus tenderness.  Mouth/Throat: Posterior oropharyngeal erythema present. No oropharyngeal exudate or posterior oropharyngeal edema.  Cardiovascular: Normal rate, regular rhythm, S1 normal, S2 normal and normal heart sounds.   Pulmonary/Chest: Effort normal. He has wheezes in the right middle  field, the right lower field, the left middle field and the left lower field.  Psychiatric: Mood, memory, affect and judgment normal.  Nursing note and vitals reviewed.     Assessment & Plan  1. Wheezing on auscultation Started on Ventolin inhaler for bronchospasm. - DG Chest 2 View; Future - albuterol (PROVENTIL HFA;VENTOLIN HFA) 108 (90 Base) MCG/ACT inhaler; Inhale 2 puffs into the lungs every 6 (six) hours as needed for wheezing or shortness of breath.  Dispense: 1 Inhaler; Refill: 2  2. Cough Likely bronchitis along with upper respiratory tract irritation, started on azithromycin for treatment -  azithromycin (ZITHROMAX) 250 MG tablet; 2 tabs po day 1,then 1 tab po q day x 4 days  Dispense: 6 each; Refill: 0   Tyrus Wilms [facility] 04/07/2016 11:17 AM

## 2016-05-06 ENCOUNTER — Ambulatory Visit: Payer: BLUE CROSS/BLUE SHIELD | Admitting: Family Medicine

## 2016-05-10 ENCOUNTER — Other Ambulatory Visit: Payer: Self-pay | Admitting: Family Medicine

## 2016-06-03 ENCOUNTER — Ambulatory Visit (INDEPENDENT_AMBULATORY_CARE_PROVIDER_SITE_OTHER): Payer: BLUE CROSS/BLUE SHIELD | Admitting: Family Medicine

## 2016-06-03 ENCOUNTER — Encounter: Payer: Self-pay | Admitting: Family Medicine

## 2016-06-03 VITALS — BP 130/70 | HR 88 | Temp 97.4°F | Resp 16 | Ht 67.0 in | Wt 182.8 lb

## 2016-06-03 DIAGNOSIS — E559 Vitamin D deficiency, unspecified: Secondary | ICD-10-CM | POA: Diagnosis not present

## 2016-06-03 DIAGNOSIS — I10 Essential (primary) hypertension: Secondary | ICD-10-CM

## 2016-06-03 DIAGNOSIS — E7800 Pure hypercholesterolemia, unspecified: Secondary | ICD-10-CM | POA: Diagnosis not present

## 2016-06-03 MED ORDER — AMLODIPINE BESYLATE 2.5 MG PO TABS: ORAL_TABLET | ORAL | 0 refills | Status: DC

## 2016-06-03 MED ORDER — ROSUVASTATIN CALCIUM 40 MG PO TABS: 40.0000 mg | ORAL_TABLET | Freq: Every day | ORAL | 0 refills | Status: DC

## 2016-06-03 NOTE — Progress Notes (Signed)
Name: Martin Cook   MRN: [id number]    DOB: [birth year]   Date:06/03/2016       Progress Note  Subjective  Chief Complaint  Chief Complaint  Patient presents with  . Follow-up    3 mo  . Medication Refill    Hypertension  This is a chronic problem. The problem is unchanged. The problem is controlled. Pertinent negatives include no blurred vision, chest pain, headaches, palpitations or shortness of breath. Past treatments include calcium channel blockers. There is no history of kidney disease, CAD/MI or CVA.  Hyperlipidemia  This is a chronic problem. The problem is uncontrolled. Recent lipid tests were reviewed and are high. Pertinent negatives include no chest pain, leg pain, myalgias or shortness of breath. Current antihyperlipidemic treatment includes statins.     Past Medical History:  Diagnosis Date  . GERD (gastroesophageal reflux disease)   . Hyperlipidemia   . Hypertension     History reviewed. No pertinent surgical history.  Family History  Problem Relation Age of Onset  . Stroke Mother   . Stroke Father   . Deep vein thrombosis Father   . Cancer Brother     Social History   Social History  . Marital status: Married    Spouse name: N/A  . Number of children: N/A  . Years of education: N/A   Occupational History  . Not on file.   Social History Main Topics  . Smoking status: Current Every Day Smoker    Packs/day: 1.00    Types: Cigarettes  . Smokeless tobacco: Never Used  . Alcohol use No     Comment: occasional  . Drug use: No  . Sexual activity: Yes   Other Topics Concern  . Not on file   Social History Narrative  . No narrative on file     Current Outpatient Prescriptions:  .  albuterol (PROVENTIL HFA;VENTOLIN HFA) 108 (90 Base) MCG/ACT inhaler, Inhale 2 puffs into the lungs every 6 (six) hours as needed for wheezing or shortness of breath., Disp: 1 Inhaler, Rfl: 2 .  amLODipine (NORVASC) 2.5 MG tablet, TAKE 1 TABLET (2.5 MG TOTAL) BY  MOUTH DAILY., Disp: 90 tablet, Rfl: 0 .  azithromycin (ZITHROMAX) 250 MG tablet, 2 tabs po day 1,then 1 tab po q day x 4 days, Disp: 6 each, Rfl: 0 .  pantoprazole (PROTONIX) 20 MG tablet, Take 1 tablet (20 mg total) by mouth daily., Disp: 90 tablet, Rfl: 0 .  rosuvastatin (CRESTOR) 40 MG tablet, Take 1 tablet (40 mg total) by mouth daily., Disp: 90 tablet, Rfl: 0 .  Vitamin D, Ergocalciferol, (DRISDOL) 50000 units CAPS capsule, Take 1 capsule (50,000 Units total) by mouth once a week. For 12 weeks (Patient not taking: Reported on 06/03/2016), Disp: 12 capsule, Rfl: 0  No Known Allergies   Review of Systems  Eyes: Negative for blurred vision.  Respiratory: Negative for shortness of breath.   Cardiovascular: Negative for chest pain and palpitations.  Musculoskeletal: Negative for myalgias.  Neurological: Negative for headaches.    Objective  Vitals:   06/03/16 1145  BP: 130/70  Pulse: 88  Resp: 16  Temp: 97.4 F (36.3 C)  TempSrc: Oral  SpO2: 94%  Weight: 182 lb 12.8 oz (82.9 kg)  Height: 5\' 7"  (1.702 m)    Physical Exam  Constitutional: He is oriented to person, place, and time and well-developed, well-nourished, and in no distress.  HENT:  Head: Normocephalic and atraumatic.  Eyes: Pupils are equal, round, and  reactive to light.  Cardiovascular: Normal rate, regular rhythm and normal heart sounds.   No murmur heard. Pulmonary/Chest: Effort normal and breath sounds normal. He has no wheezes.  Abdominal: Soft. Bowel sounds are normal. There is no tenderness.  Neurological: He is alert and oriented to person, place, and time.  Psychiatric: Mood, memory, affect and judgment normal.  Nursing note and vitals reviewed.      Assessment & Plan  1. Essential hypertension BP stable on present anti- hypertensive therapy - amLODipine (NORVASC) 2.5 MG tablet; TAKE 1 TABLET (2.5 MG TOTAL) BY MOUTH DAILY.  Dispense: 90 tablet; Refill: 0  2. Pure hypercholesterolemia Obtain  FLP, continue on statin - Lipid panel - rosuvastatin (CRESTOR) 40 MG tablet; Take 1 tablet (40 mg total) by mouth daily.  Dispense: 90 tablet; Refill: 0  3. Vitamin D deficiency Patient has finished 12 weeks of vitamin D, recheck levels today - VITAMIN D 25 Hydroxy (Vit-D Deficiency, Fractures)   Martin Cook [facility] 06/03/2016 12:00 PM

## 2016-06-07 DIAGNOSIS — E7800 Pure hypercholesterolemia, unspecified: Secondary | ICD-10-CM | POA: Diagnosis not present

## 2016-06-07 DIAGNOSIS — E559 Vitamin D deficiency, unspecified: Secondary | ICD-10-CM | POA: Diagnosis not present

## 2016-06-07 LAB — LIPID PANEL
CHOL/HDL RATIO: 2.9 ratio (ref <5.0)
Cholesterol: 171 mg/dL (ref <200)
HDL: 59 mg/dL (ref >40)
LDL Cholesterol: 73 mg/dL (ref <100)
Triglycerides: 197 mg/dL — ABNORMAL HIGH (ref <150)
VLDL: 39 mg/dL — ABNORMAL HIGH (ref <30)

## 2016-06-08 ENCOUNTER — Telehealth: Payer: Self-pay

## 2016-06-08 LAB — VITAMIN D 25 HYDROXY (VIT D DEFICIENCY, FRACTURES): VIT D 25 HYDROXY: 27 ng/mL — AB (ref 30–100)

## 2016-06-08 MED ORDER — VITAMIN D (ERGOCALCIFEROL) 1.25 MG (50000 UNIT) PO CAPS
50000.0000 [IU] | ORAL_CAPSULE | ORAL | 0 refills | Status: DC
Start: 2016-06-08 — End: 2016-12-15

## 2016-06-08 NOTE — Telephone Encounter (Signed)
Patient has been notified of lab results and a prescription for vitamin D3 50,000 units take 1 capsule once a week x12 weeks has been sent to [facility] per Dr. Shah, patient has been notified  

## 2016-06-12 ENCOUNTER — Other Ambulatory Visit: Payer: Self-pay | Admitting: Family Medicine

## 2016-06-12 DIAGNOSIS — K219 Gastro-esophageal reflux disease without esophagitis: Secondary | ICD-10-CM

## 2016-06-15 NOTE — Telephone Encounter (Signed)
Medication has been refilled and sent to [facility] 

## 2016-08-30 ENCOUNTER — Other Ambulatory Visit: Payer: Self-pay | Admitting: Family Medicine

## 2016-08-30 DIAGNOSIS — I10 Essential (primary) hypertension: Secondary | ICD-10-CM

## 2016-09-03 ENCOUNTER — Ambulatory Visit (INDEPENDENT_AMBULATORY_CARE_PROVIDER_SITE_OTHER): Payer: BLUE CROSS/BLUE SHIELD | Admitting: Family Medicine

## 2016-09-03 ENCOUNTER — Encounter: Payer: Self-pay | Admitting: Family Medicine

## 2016-09-03 VITALS — BP 126/72 | HR 87 | Temp 97.3°F | Resp 16 | Ht 67.0 in | Wt 181.9 lb

## 2016-09-03 DIAGNOSIS — K219 Gastro-esophageal reflux disease without esophagitis: Secondary | ICD-10-CM

## 2016-09-03 DIAGNOSIS — I10 Essential (primary) hypertension: Secondary | ICD-10-CM

## 2016-09-03 DIAGNOSIS — E7800 Pure hypercholesterolemia, unspecified: Secondary | ICD-10-CM

## 2016-09-03 DIAGNOSIS — E559 Vitamin D deficiency, unspecified: Secondary | ICD-10-CM | POA: Diagnosis not present

## 2016-09-03 MED ORDER — PANTOPRAZOLE SODIUM 20 MG PO TBEC: 20.0000 mg | DELAYED_RELEASE_TABLET | Freq: Every day | ORAL | 0 refills | Status: DC

## 2016-09-03 MED ORDER — ROSUVASTATIN CALCIUM 40 MG PO TABS: 40.0000 mg | ORAL_TABLET | Freq: Every day | ORAL | 0 refills | Status: DC

## 2016-09-03 NOTE — Progress Notes (Signed)
Name: Martin Cook   MRN: [id number]    DOB: Sep 30, 1960   Date:09/03/2016       Progress Note  Subjective  Chief Complaint  Chief Complaint  Patient presents with  . Follow-up    3 mo  . Medication Refill  . Hyperlipidemia  . Hypertension    Hyperlipidemia  This is a chronic problem. The problem is controlled. Recent lipid tests were reviewed and are normal. Pertinent negatives include no chest pain, leg pain, myalgias or shortness of breath. Current antihyperlipidemic treatment includes statins.  Hypertension  This is a chronic problem. The problem is unchanged. The problem is controlled. Pertinent negatives include no blurred vision, chest pain, headaches, palpitations or shortness of breath. Past treatments include calcium channel blockers. There is no history of kidney disease, CAD/MI or CVA.  Gastroesophageal Reflux  He reports no belching, no chest pain, no coughing or no heartburn. This is a chronic problem. The symptoms are aggravated by certain foods (spicy foods, sodas). He has tried a PPI for the symptoms.     Past Medical History:  Diagnosis Date  . GERD (gastroesophageal reflux disease)   . Hyperlipidemia   . Hypertension     History reviewed. No pertinent surgical history.  Family History  Problem Relation Age of Onset  . Stroke Mother   . Stroke Father   . Deep vein thrombosis Father   . Cancer Brother     Social History   Social History  . Marital status: Married    Spouse name: N/A  . Number of children: N/A  . Years of education: N/A   Occupational History  . Not on file.   Social History Main Topics  . Smoking status: Current Every Day Smoker    Packs/day: 1.00    Types: Cigarettes  . Smokeless tobacco: Never Used  . Alcohol use No     Comment: occasional  . Drug use: No  . Sexual activity: Yes   Other Topics Concern  . Not on file   Social History Narrative  . No narrative on file     Current Outpatient Prescriptions:  .   albuterol (PROVENTIL HFA;VENTOLIN HFA) 108 (90 Base) MCG/ACT inhaler, Inhale 2 puffs into the lungs every 6 (six) hours as needed for wheezing or shortness of breath., Disp: 1 Inhaler, Rfl: 2 .  amLODipine (NORVASC) 2.5 MG tablet, TAKE 1 TABLET BY MOUTH EVERY DAY, Disp: 90 tablet, Rfl: 0 .  azithromycin (ZITHROMAX) 250 MG tablet, 2 tabs po day 1,then 1 tab po q day x 4 days, Disp: 6 each, Rfl: 0 .  pantoprazole (PROTONIX) 20 MG tablet, TAKE 1 TABLET (20 MG TOTAL) BY MOUTH DAILY., Disp: 90 tablet, Rfl: 0 .  rosuvastatin (CRESTOR) 40 MG tablet, Take 1 tablet (40 mg total) by mouth daily., Disp: 90 tablet, Rfl: 0 .  Vitamin D, Ergocalciferol, (DRISDOL) 50000 units CAPS capsule, Take 1 capsule (50,000 Units total) by mouth once a week. For 12 weeks (Patient not taking: Reported on 09/03/2016), Disp: 12 capsule, Rfl: 0  No Known Allergies   Review of Systems  Eyes: Negative for blurred vision.  Respiratory: Negative for cough and shortness of breath.   Cardiovascular: Negative for chest pain and palpitations.  Gastrointestinal: Negative for heartburn.  Musculoskeletal: Negative for myalgias.  Neurological: Negative for headaches.     Objective  Vitals:   09/03/16 0846  BP: 126/72  Pulse: 87  Resp: 16  Temp: (!) 97.3 F (36.3 C)  TempSrc: Oral  SpO2: 96%  Weight: 181 lb 14.4 oz (82.5 kg)  Height: 5\' 7"  (1.702 m)    Physical Exam  Constitutional: He is oriented to person, place, and time and well-developed, well-nourished, and in no distress.  HENT:  Head: Normocephalic and atraumatic.  Cardiovascular: Normal rate, regular rhythm and normal heart sounds.   No murmur heard. Pulmonary/Chest: Effort normal and breath sounds normal. He has no wheezes.  Abdominal: Soft. Bowel sounds are normal. There is no tenderness.  Musculoskeletal: He exhibits no edema.  Neurological: He is alert and oriented to person, place, and time.  Psychiatric: Mood, memory, affect and judgment normal.   Nursing note and vitals reviewed.    Assessment & Plan  1. Gastroesophageal reflux disease, esophagitis presence not specified Symptoms of reflux are responsive to PPI - pantoprazole (PROTONIX) 20 MG tablet; Take 1 tablet (20 mg total) by mouth daily.  Dispense: 90 tablet; Refill: 0  2. Pure hypercholesterolemia  - rosuvastatin (CRESTOR) 40 MG tablet; Take 1 tablet (40 mg total) by mouth daily.  Dispense: 90 tablet; Refill: 0 - Lipid panel  3. Essential hypertension BP stable on present anti- hypertensive treatment  4. Vitamin D insufficiency Obtain vitamin D levels - VITAMIN D 25 Hydroxy (Vit-D Deficiency, Fractures)   Elleah Hemsley [facility] 09/03/2016 8:51 AM

## 2016-09-04 LAB — LIPID PANEL
CHOLESTEROL: 173 mg/dL (ref <200)
HDL: 58 mg/dL (ref >40)
LDL Cholesterol: 84 mg/dL (ref <100)
Total CHOL/HDL Ratio: 3 Ratio (ref <5.0)
Triglycerides: 154 mg/dL — ABNORMAL HIGH (ref <150)
VLDL: 31 mg/dL — AB (ref <30)

## 2016-09-04 LAB — VITAMIN D 25 HYDROXY (VIT D DEFICIENCY, FRACTURES): Vit D, 25-Hydroxy: 42 ng/mL (ref 30–100)

## 2016-09-14 ENCOUNTER — Other Ambulatory Visit: Payer: Self-pay | Admitting: Family Medicine

## 2016-09-14 DIAGNOSIS — K219 Gastro-esophageal reflux disease without esophagitis: Secondary | ICD-10-CM

## 2016-09-26 ENCOUNTER — Other Ambulatory Visit: Payer: Self-pay | Admitting: Family Medicine

## 2016-11-26 ENCOUNTER — Other Ambulatory Visit: Payer: Self-pay | Admitting: Family Medicine

## 2016-11-26 DIAGNOSIS — I10 Essential (primary) hypertension: Secondary | ICD-10-CM

## 2016-12-03 ENCOUNTER — Ambulatory Visit: Payer: BLUE CROSS/BLUE SHIELD | Admitting: Family Medicine

## 2016-12-07 ENCOUNTER — Other Ambulatory Visit: Payer: Self-pay | Admitting: Family Medicine

## 2016-12-07 DIAGNOSIS — E7800 Pure hypercholesterolemia, unspecified: Secondary | ICD-10-CM

## 2016-12-15 ENCOUNTER — Encounter: Payer: Self-pay | Admitting: Family Medicine

## 2016-12-15 ENCOUNTER — Ambulatory Visit: Payer: BLUE CROSS/BLUE SHIELD | Admitting: Family Medicine

## 2016-12-15 VITALS — BP 128/84 | HR 83 | Temp 97.5°F | Resp 16 | Ht 67.0 in | Wt 184.9 lb

## 2016-12-15 DIAGNOSIS — I10 Essential (primary) hypertension: Secondary | ICD-10-CM | POA: Diagnosis not present

## 2016-12-15 DIAGNOSIS — E7800 Pure hypercholesterolemia, unspecified: Secondary | ICD-10-CM | POA: Diagnosis not present

## 2016-12-15 DIAGNOSIS — K219 Gastro-esophageal reflux disease without esophagitis: Secondary | ICD-10-CM

## 2016-12-15 MED ORDER — PANTOPRAZOLE SODIUM 20 MG PO TBEC: 20.0000 mg | DELAYED_RELEASE_TABLET | Freq: Every day | ORAL | 0 refills | Status: DC

## 2016-12-15 NOTE — Progress Notes (Signed)
Name: Martin Cook   MRN: [id number]    DOB: 1960-05-29   Date:12/15/2016       Progress Note  Subjective  Chief Complaint  Chief Complaint  Patient presents with  . Follow-up    3 months   . Hypertension    f/u  . Hyperlipidemia    with fasting labs  . Gastroesophageal Reflux    refill on protonix    Hypertension  This is a chronic problem. The problem is unchanged. The problem is controlled. Pertinent negatives include no blurred vision, chest pain, headaches, palpitations, shortness of breath or sweats. Past treatments include calcium channel blockers. There is no history of kidney disease, CAD/MI or CVA.  Hyperlipidemia  This is a chronic problem. The problem is controlled. Recent lipid tests were reviewed and are normal. Pertinent negatives include no chest pain, leg pain, myalgias or shortness of breath. Current antihyperlipidemic treatment includes statins.  Gastroesophageal Reflux  He reports no abdominal pain, no chest pain, no coughing, no heartburn, no sore throat or no tooth decay. This is a chronic problem. The problem occurs constantly. He has tried a PPI for the symptoms.     Past Medical History:  Diagnosis Date  . GERD (gastroesophageal reflux disease)   . Hyperlipidemia   . Hypertension     History reviewed. No pertinent surgical history.  Family History  Problem Relation Age of Onset  . Stroke Mother   . Stroke Father   . Deep vein thrombosis Father   . Cancer Brother     Social History   Socioeconomic History  . Marital status: Married    Spouse name: Not on file  . Number of children: Not on file  . Years of education: Not on file  . Highest education level: Not on file  Social Needs  . Financial resource strain: Not on file  . Food insecurity - worry: Not on file  . Food insecurity - inability: Not on file  . Transportation needs - medical: Not on file  . Transportation needs - non-medical: Not on file  Occupational History  . Not on  file  Tobacco Use  . Smoking status: Current Every Day Smoker    Packs/day: 1.00    Types: Cigarettes  . Smokeless tobacco: Never Used  Substance and Sexual Activity  . Alcohol use: No    Alcohol/week: 0.0 oz    Comment: occasional  . Drug use: No  . Sexual activity: Yes  Other Topics Concern  . Not on file  Social History Narrative  . Not on file     Current Outpatient Medications:  .  amLODipine (NORVASC) 2.5 MG tablet, TAKE 1 TABLET BY MOUTH EVERY DAY, Disp: 90 tablet, Rfl: 0 .  pantoprazole (PROTONIX) 20 MG tablet, TAKE 1 TABLET BY MOUTH EVERY DAY, Disp: 90 tablet, Rfl: 0 .  rosuvastatin (CRESTOR) 40 MG tablet, TAKE 1 TABLET BY MOUTH EVERY DAY, Disp: 90 tablet, Rfl: 0 .  albuterol (PROVENTIL HFA;VENTOLIN HFA) 108 (90 Base) MCG/ACT inhaler, Inhale 2 puffs into the lungs every 6 (six) hours as needed for wheezing or shortness of breath. (Patient not taking: Reported on 12/15/2016), Disp: 1 Inhaler, Rfl: 2 .  Vitamin D, Ergocalciferol, (DRISDOL) 50000 units CAPS capsule, Take 1 capsule (50,000 Units total) by mouth once a week. For 12 weeks (Patient not taking: Reported on 09/03/2016), Disp: 12 capsule, Rfl: 0  No Known Allergies   Review of Systems  HENT: Negative for sore throat.   Eyes: Negative  for blurred vision.  Respiratory: Negative for cough and shortness of breath.   Cardiovascular: Negative for chest pain and palpitations.  Gastrointestinal: Negative for abdominal pain and heartburn.  Musculoskeletal: Negative for myalgias.  Neurological: Negative for headaches.    Objective  Vitals:   12/15/16 0948  BP: 128/84  Pulse: 83  Resp: 16  Temp: (!) 97.5 F (36.4 C)  TempSrc: Oral  SpO2: 94%  Weight: 184 lb 14.4 oz (83.9 kg)  Height: 5\' 7"  (1.702 m)    Physical Exam  Constitutional: He is oriented to person, place, and time and well-developed, well-nourished, and in no distress.  HENT:  Head: Normocephalic and atraumatic.  Eyes: Pupils are equal, round,  and reactive to light.  Cardiovascular: Normal rate, regular rhythm and normal heart sounds.  No murmur heard. Pulmonary/Chest: Effort normal and breath sounds normal. He has no wheezes.  Abdominal: Soft. Bowel sounds are normal. There is no tenderness.  Neurological: He is alert and oriented to person, place, and time.  Psychiatric: Mood, memory, affect and judgment normal.  Nursing note and vitals reviewed.       Assessment & Plan  1. Gastroesophageal reflux disease, esophagitis presence not specified Symptoms of reflux are stable on PPI - pantoprazole (PROTONIX) 20 MG tablet; Take 1 tablet (20 mg total) by mouth daily.  Dispense: 90 tablet; Refill: 0  2. Essential hypertension Blood pressure stable on present anti- hypertensive treatment  3. Pure hypercholesterolemia Her triglycerides from lab work in August 2018, normal total cholesterol and LDL cholesterol. We will repeat in 3 months, continue on statin   Gala Padovano Asad [facility] 12/15/2016 9:56 AM

## 2017-02-16 ENCOUNTER — Encounter: Payer: Self-pay | Admitting: Family Medicine

## 2017-02-16 ENCOUNTER — Other Ambulatory Visit: Payer: Self-pay

## 2017-02-16 ENCOUNTER — Ambulatory Visit (INDEPENDENT_AMBULATORY_CARE_PROVIDER_SITE_OTHER): Payer: BLUE CROSS/BLUE SHIELD | Admitting: Family Medicine

## 2017-02-16 VITALS — BP 128/86 | HR 84 | Temp 98.0°F | Resp 16 | Ht 67.0 in | Wt 183.0 lb

## 2017-02-16 DIAGNOSIS — Z125 Encounter for screening for malignant neoplasm of prostate: Secondary | ICD-10-CM

## 2017-02-16 DIAGNOSIS — I10 Essential (primary) hypertension: Secondary | ICD-10-CM

## 2017-02-16 DIAGNOSIS — Z0000 Encounter for general adult medical examination without abnormal findings: Secondary | ICD-10-CM | POA: Diagnosis not present

## 2017-02-16 DIAGNOSIS — R062 Wheezing: Secondary | ICD-10-CM | POA: Diagnosis not present

## 2017-02-16 DIAGNOSIS — Z1159 Encounter for screening for other viral diseases: Secondary | ICD-10-CM

## 2017-02-16 DIAGNOSIS — Z0001 Encounter for general adult medical examination with abnormal findings: Secondary | ICD-10-CM

## 2017-02-16 DIAGNOSIS — R7303 Prediabetes: Secondary | ICD-10-CM | POA: Diagnosis not present

## 2017-02-16 DIAGNOSIS — E7800 Pure hypercholesterolemia, unspecified: Secondary | ICD-10-CM

## 2017-02-16 DIAGNOSIS — K219 Gastro-esophageal reflux disease without esophagitis: Secondary | ICD-10-CM

## 2017-02-16 MED ORDER — PANTOPRAZOLE SODIUM 20 MG PO TBEC: 20.0000 mg | DELAYED_RELEASE_TABLET | Freq: Every day | ORAL | 0 refills | Status: DC

## 2017-02-16 MED ORDER — ROSUVASTATIN CALCIUM 40 MG PO TABS: 40.0000 mg | ORAL_TABLET | Freq: Every day | ORAL | 0 refills | Status: DC

## 2017-02-16 MED ORDER — AMLODIPINE BESYLATE 2.5 MG PO TABS: 2.5000 mg | ORAL_TABLET | Freq: Every day | ORAL | 0 refills | Status: DC

## 2017-02-16 NOTE — Progress Notes (Signed)
Name: Martin Cook   MRN: [id number]    DOB: 1960-10-13   Date:02/16/2017       Progress Note  Subjective  Chief Complaint  Chief Complaint  Patient presents with  . Annual Exam    HPI  Pt. Presents for Annual Physical Exam.  He did Cologuard last year for colon cancer screening but results are not available.  He is due for Prostate cancer screening.  He is due for Hepatitis C screening.    Past Medical History:  Diagnosis Date  . GERD (gastroesophageal reflux disease)   . Hyperlipidemia   . Hypertension     History reviewed. No pertinent surgical history.  Family History  Problem Relation Age of Onset  . Stroke Mother   . Stroke Father   . Deep vein thrombosis Father   . Cancer Brother   . Stroke Maternal Grandfather        Pt is unsure     Social History   Socioeconomic History  . Marital status: Married    Spouse name: Not on file  . Number of children: Not on file  . Years of education: Not on file  . Highest education level: Not on file  Social Needs  . Financial resource strain: Not on file  . Food insecurity - worry: Not on file  . Food insecurity - inability: Not on file  . Transportation needs - medical: Not on file  . Transportation needs - non-medical: Not on file  Occupational History  . Not on file  Tobacco Use  . Smoking status: Current Every Day Smoker    Packs/day: 1.00    Types: Cigarettes  . Smokeless tobacco: Never Used  Substance and Sexual Activity  . Alcohol use: No    Alcohol/week: 0.0 oz    Comment: occasional  . Drug use: No  . Sexual activity: Yes  Other Topics Concern  . Not on file  Social History Narrative  . Not on file     Current Outpatient Medications:  .  amLODipine (NORVASC) 2.5 MG tablet, TAKE 1 TABLET BY MOUTH EVERY DAY, Disp: 90 tablet, Rfl: 0 .  pantoprazole (PROTONIX) 20 MG tablet, Take 1 tablet (20 mg total) by mouth daily., Disp: 90 tablet, Rfl: 0 .  rosuvastatin (CRESTOR) 40 MG tablet, TAKE 1 TABLET  BY MOUTH EVERY DAY, Disp: 90 tablet, Rfl: 0 .  albuterol (PROVENTIL HFA;VENTOLIN HFA) 108 (90 Base) MCG/ACT inhaler, Inhale 2 puffs into the lungs every 6 (six) hours as needed for wheezing or shortness of breath. (Patient not taking: Reported on 12/15/2016), Disp: 1 Inhaler, Rfl: 2  No Known Allergies   Review of Systems  Constitutional: Negative for chills, fever and malaise/fatigue.  HENT: Negative for congestion, ear pain, sinus pain and sore throat.   Eyes: Negative for blurred vision and double vision.  Respiratory: Negative for cough, hemoptysis and shortness of breath.   Cardiovascular: Negative for chest pain, palpitations and leg swelling.  Gastrointestinal: Negative for abdominal pain, blood in stool, constipation, diarrhea, nausea and vomiting.  Genitourinary: Negative for dysuria and hematuria.  Musculoskeletal: Negative for back pain (occasional low back pain.) and neck pain.  Skin: Negative for rash.  Neurological: Negative for dizziness and headaches.  Psychiatric/Behavioral: Negative for depression. The patient is not nervous/anxious and does not have insomnia.     Objective  Vitals:   02/16/17 0902  BP: 128/86  Pulse: 84  Resp: 16  Temp: 98 F (36.7 C)  TempSrc: Oral  SpO2: 97%  Weight: 183 lb (83 kg)  Height: 5\' 7"  (1.702 m)    Physical Exam  Constitutional: He is oriented to person, place, and time and well-developed, well-nourished, and in no distress.  HENT:  Head: Normocephalic and atraumatic.  Right Ear: Tympanic membrane, external ear and ear canal normal.  Left Ear: Tympanic membrane and external ear normal.  Mouth/Throat: Posterior oropharyngeal erythema present.  Left ear canal has whitish debris  Neck: Neck supple.  Cardiovascular: Normal rate, regular rhythm, S1 normal, S2 normal and normal heart sounds.  No murmur heard. Pulmonary/Chest: No respiratory distress. He has wheezes in the left lower field.  Abdominal: Soft. Bowel sounds are  normal. There is no tenderness.  Genitourinary: Rectum normal and prostate normal. Prostate is not tender.  Genitourinary Comments: Difficult exam due to patient discomfort, prostate appears marginally enlarged.   Musculoskeletal:       Right ankle: He exhibits no swelling.       Left ankle: He exhibits no swelling.  Neurological: He is alert and oriented to person, place, and time.  Skin: Skin is warm and intact.  Psychiatric: Mood, memory, affect and judgment normal.  Nursing note and vitals reviewed.     Assessment & Plan  1. Annual physical exam Obtain age appropriate laboratory screening - CBC with Differential/Platelet - TSH - VITAMIN D 25 Hydroxy (Vit-D Deficiency, Fractures)  2. Screening for prostate cancer  - PSA  3. Need for hepatitis C screening test  - Hepatitis C antibody  4. Prediabetes  - HgB A1c  5. Wheezing on auscultationLeft lower lobe wheezing, otherwise patient well-appearing, no coughing, suspect reflux versus early COPD, advised follow-up.     Sakshi Sermons [facility] 02/16/2017 9:15 AM

## 2017-02-17 ENCOUNTER — Other Ambulatory Visit: Payer: Self-pay

## 2017-02-17 DIAGNOSIS — E559 Vitamin D deficiency, unspecified: Secondary | ICD-10-CM

## 2017-02-17 MED ORDER — VITAMIN D (ERGOCALCIFEROL) 1.25 MG (50000 UNIT) PO CAPS: 50000.0000 [IU] | ORAL_CAPSULE | ORAL | 0 refills | Status: DC

## 2017-02-18 LAB — CBC WITH DIFFERENTIAL/PLATELET
BASOS ABS: 38 {cells}/uL (ref 0–200)
Basophils Relative: 0.7 %
EOS ABS: 92 {cells}/uL (ref 15–500)
Eosinophils Relative: 1.7 %
HCT: 45.8 % (ref 38.5–50.0)
Hemoglobin: 16 g/dL (ref 13.2–17.1)
Lymphs Abs: 1528 cells/uL (ref 850–3900)
MCH: 30.8 pg (ref 27.0–33.0)
MCHC: 34.9 g/dL (ref 32.0–36.0)
MCV: 88.1 fL (ref 80.0–100.0)
MPV: 9.9 fL (ref 7.5–12.5)
Monocytes Relative: 7.6 %
Neutro Abs: 3332 cells/uL (ref 1500–7800)
Neutrophils Relative %: 61.7 %
PLATELETS: 213 10*3/uL (ref 140–400)
RBC: 5.2 10*6/uL (ref 4.20–5.80)
RDW: 13.1 % (ref 11.0–15.0)
TOTAL LYMPHOCYTE: 28.3 %
WBC mixed population: 410 cells/uL (ref 200–950)
WBC: 5.4 10*3/uL (ref 3.8–10.8)

## 2017-02-18 LAB — HEPATITIS C ANTIBODY
Hepatitis C Ab: NONREACTIVE
SIGNAL TO CUT-OFF: 0.01 (ref <1.00)

## 2017-02-18 LAB — HEMOGLOBIN A1C
EAG (MMOL/L): 6.6 (calc)
Hgb A1c MFr Bld: 5.8 % of total Hgb — ABNORMAL HIGH (ref <5.7)
MEAN PLASMA GLUCOSE: 120 (calc)

## 2017-02-18 LAB — TSH: TSH: 1.5 m[IU]/L (ref 0.40–4.50)

## 2017-02-18 LAB — PSA: PSA: 0.3 ng/mL (ref <=4.0)

## 2017-02-18 LAB — VITAMIN D 25 HYDROXY (VIT D DEFICIENCY, FRACTURES): Vit D, 25-Hydroxy: 17 ng/mL — ABNORMAL LOW (ref 30–100)

## 2017-03-11 ENCOUNTER — Telehealth: Payer: Self-pay | Admitting: Family Medicine

## 2017-03-11 NOTE — Telephone Encounter (Signed)
Copied from CRM [phone]#[id number]. Topic: Quick Communication - See Telephone Encounter >> Mar 11, 2017  2:27 PM Arlyss Gandyichardson, Jemiah Cuadra N, NT wrote: CRM for notification. See Telephone encounter for: Pt calling and states the pharmacy needing a PA for pt to have rosuvastatin (CRESTOR) and  pantoprazole (PROTONIX) filled. [facility] [address] [facility] [place].  03/11/17.

## 2017-03-14 NOTE — Telephone Encounter (Signed)
PA was performed online via CoverMyMeds. It stated it takes 72 hours.

## 2017-03-16 MED ORDER — OMEPRAZOLE 20 MG PO CPDR: 20.0000 mg | DELAYED_RELEASE_CAPSULE | Freq: Every day | ORAL | 0 refills | Status: DC

## 2017-03-16 NOTE — Telephone Encounter (Signed)
If protonix not covered, I can switch that Please let him know about the crestor (already approved apparently), and we'll switch his PPI Have him call us with any problems Thank you

## 2017-03-16 NOTE — Addendum Note (Signed)
Addended by: Girtrude Enslin, Janit BernMELINDA P on: 03/16/2017 04:42 PM   Modules accepted: Orders

## 2017-03-16 NOTE — Telephone Encounter (Signed)
Huntley DecSara, w/ [employer] calling to notify that no PA is required because the patient did receive approval for rosuvastatin (generic) for 90 per 90 days.

## 2017-03-16 NOTE — Telephone Encounter (Signed)
This message was routed to me Do I need to do anything? Is the patient aware there is no need for prior auth? Thank you

## 2017-03-17 NOTE — Telephone Encounter (Signed)
I contacted this patient to inform him of the messages below but there was no answer. A message was left for him on his voicemail informing him of th PA and the change in medication.   Patient was asked to give our office a call if he had any additional questions or concerns.

## 2017-03-28 ENCOUNTER — Ambulatory Visit: Payer: Self-pay

## 2017-03-28 NOTE — Telephone Encounter (Signed)
Patient called in with c/o "body aches, cough, loss of appetite, generalized weakness." He says "every since I received a call about the recall of my amlodipine, this jumped on me. I have a loss of appetite, weakness, coughing since I started the medicine 02/16/17, and body aches. How long will it take the medicine to get out of my system?" I asked when was the last dose, he said "Thursday." I advised the medication should be out of his system. He said "I was started on a new BP medication, omeprazole." I advised that omeprazole is for acid reflux, not BP." He said "I take pantoprazole for my acid reflux. So I've been taking both of these pills." I advised according to the chart, he was prescribed omeprazole because the insurance didn't pay for the pantoprazole. So he will need to take the omeprazole and not the pantoprazole. He says "I have a full bottle and I hate to throw them away, but I will do what you say." I asked him how severe are the body aches, he says "about a 3, not that bad, but enough to notice something is going on with me. This is bringing me down and I don't know what to do." I asked about tolerating fluids, he says "I can drink, but I don't have an appetite to do anything." I advised him to force himself to drink in order to stay hydrated, he verbalized understanding. I asked about fever, he says "I haven't checked it, but I've had chills and sweats." He denies CP, nausea, vomiting. According to protocol, see PCP within 3 days, no availability with PCP, appointment made for Tuesday, 03/29/17 at 9 am with Maurice SmallEmily Boyce, FNP-C. Care advice given, patient verbalized understanding.  Reason for Disposition . [1] MILD or MODERATE muscle aches or pain AND [2] taking a statin medicine (a lipid or cholesterol lowering drug)  Answer Assessment - Initial Assessment Questions 1. ONSET: "When did the muscle aches or body pains start?"      Last week 2. LOCATION: "What part of your body is hurting?" (e.g.,  entire body, arms, legs)      Generalized 3. SEVERITY: "How bad is the pain?" (Scale 1-10; or mild, moderate, severe)   - MILD (1-3): doesn't interfere with normal activities    - MODERATE (4-7): interferes with normal activities or awakens from sleep    - SEVERE (8-10):  excruciating pain, unable to do any normal activities      3 4. CAUSE: "What do you think is causing the pains?"     Amlodipine 5. FEVER: "Have you been having fever?"     No fever, but had body chills, sweats 6. OTHER SYMPTOMS: "Do you have any other symptoms?" (e.g., chest pain, weakness, rash, cold or flu symptoms, weight loss)     Loss of appetite, weakness 7. PREGNANCY: "Is there any chance you are pregnant?" "When was your last menstrual period?"     N/A 8. TRAVEL: "Have you traveled out of the country in the last month?" (e.g., travel history, exposures)     No  Protocols used: MUSCLE ACHES AND BODY PAIN-A-AH

## 2017-03-29 ENCOUNTER — Encounter: Payer: Self-pay | Admitting: Family Medicine

## 2017-03-29 ENCOUNTER — Ambulatory Visit: Payer: BLUE CROSS/BLUE SHIELD | Admitting: Family Medicine

## 2017-03-29 VITALS — BP 130/80 | Temp 98.2°F | Resp 18 | Ht 67.0 in | Wt 179.0 lb

## 2017-03-29 DIAGNOSIS — J181 Lobar pneumonia, unspecified organism: Secondary | ICD-10-CM | POA: Diagnosis not present

## 2017-03-29 DIAGNOSIS — J189 Pneumonia, unspecified organism: Secondary | ICD-10-CM

## 2017-03-29 DIAGNOSIS — R05 Cough: Secondary | ICD-10-CM

## 2017-03-29 DIAGNOSIS — N529 Male erectile dysfunction, unspecified: Secondary | ICD-10-CM | POA: Insufficient documentation

## 2017-03-29 DIAGNOSIS — R059 Cough, unspecified: Secondary | ICD-10-CM

## 2017-03-29 DIAGNOSIS — Z720 Tobacco use: Secondary | ICD-10-CM | POA: Diagnosis not present

## 2017-03-29 DIAGNOSIS — R062 Wheezing: Secondary | ICD-10-CM

## 2017-03-29 MED ORDER — DOXYCYCLINE HYCLATE 100 MG PO TABS: 100.0000 mg | ORAL_TABLET | Freq: Two times a day (BID) | ORAL | 0 refills | Status: AC

## 2017-03-29 MED ORDER — BENZONATATE 100 MG PO CAPS: 100.0000 mg | ORAL_CAPSULE | Freq: Two times a day (BID) | ORAL | 0 refills | Status: DC | PRN

## 2017-03-29 NOTE — Patient Instructions (Addendum)
Community-Acquired Pneumonia, Adult Pneumonia is an infection of the lungs. One type of pneumonia can happen while a person is in a hospital. A different type can happen when a person is not in a hospital (community-acquired pneumonia). It is easy for this kind to spread from person to person. It can spread to you if you breathe near an infected person who coughs or sneezes. Some symptoms include:  A dry cough.  A wet (productive) cough.  Fever.  Sweating.  Chest pain.  Follow these instructions at home:  Take over-the-counter and prescription medicines only as told by your doctor. ? Only take cough medicine if you are losing sleep. ? If you were prescribed an antibiotic medicine, take it as told by your doctor. Do not stop taking the antibiotic even if you start to feel better.  Sleep with your head and neck raised (elevated). You can do this by putting a few pillows under your head, or you can sleep in a recliner.  Do not use tobacco products. These include cigarettes, chewing tobacco, and e-cigarettes. If you need help quitting, ask your doctor.  Drink enough water to keep your pee (urine) clear or pale yellow. A shot (vaccine) can help prevent pneumonia. Shots are often suggested for:  People older than 58 years of age.  People older than 58 years of age: ? Who are having cancer treatment. ? Who have long-term (chronic) lung disease. ? Who have problems with their body's defense system (immune system).  You may also prevent pneumonia if you take these actions:  Get the flu (influenza) shot every year.  Go to the dentist as often as told.  Wash your hands often. If soap and water are not available, use hand sanitizer.  Contact a doctor if:  You have a fever.  You lose sleep because your cough medicine does not help. Get help right away if:  You are short of breath and it gets worse.  You have more chest pain.  Your sickness gets worse. This is very serious  if: ? You are an older adult. ? Your body's defense system is weak.  You cough up blood. This information is not intended to replace advice given to you by your health care provider. Make sure you discuss any questions you have with your health care provider. Document Released: 06/23/2007 Document Revised: 06/12/2015 Document Reviewed: 05/01/2014 Elsevier Interactive Patient Education  2018 [employer].  Coping with Quitting Smoking Quitting smoking is a physical and mental challenge. You will face cravings, withdrawal symptoms, and temptation. Before quitting, work with your health care provider to make a plan that can help you cope. Preparation can help you quit and keep you from giving in. How can I cope with cravings? Cravings usually last for 5-10 minutes. If you get through it, the craving will pass. Consider taking the following actions to help you cope with cravings:  Keep your mouth busy: ? Chew sugar-free gum. ? Suck on hard candies or a straw. ? Brush your teeth.  Keep your hands and body busy: ? Immediately change to a different activity when you feel a craving. ? Squeeze or play with a ball. ? Do an activity or a hobby, like making bead jewelry, practicing needlepoint, or working with wood. ? Mix up your normal routine. ? Take a short exercise break. Go for a quick walk or run up and down stairs. ? Spend time in public places where smoking is not allowed.  Focus on doing something kind  or helpful for someone else.  Call a friend or family member to talk during a craving.  Join a support group.  Call a quit line, such as 1-800-QUIT-NOW.  Talk with your health care provider about medicines that might help you cope with cravings and make quitting easier for you.  How can I deal with withdrawal symptoms? Your body may experience negative effects as it tries to get used to not having nicotine in the system. These effects are called withdrawal symptoms. They may  include:  Feeling hungrier than normal.  Trouble concentrating.  Irritability.  Trouble sleeping.  Feeling depressed.  Restlessness and agitation.  Craving a cigarette.  To manage withdrawal symptoms:  Avoid places, people, and activities that trigger your cravings.  Remember why you want to quit.  Get plenty of sleep.  Avoid coffee and other caffeinated drinks. These may worsen some of your symptoms.  How can I handle social situations? Social situations can be difficult when you are quitting smoking, especially in the first few weeks. To manage this, you can:  Avoid parties, bars, and other social situations where people might be smoking.  Avoid alcohol.  Leave right away if you have the urge to smoke.  Explain to your family and friends that you are quitting smoking. Ask for understanding and support.  Plan activities with friends or family where smoking is not an option.  What are some ways I can cope with stress? Wanting to smoke may cause stress, and stress can make you want to smoke. Find ways to manage your stress. Relaxation techniques can help. For example:  Breathe slowly and deeply, in through your nose and out through your mouth.  Listen to soothing, relaxing music.  Talk with a family member or friend about your stress.  Light a candle.  Soak in a bath or take a shower.  Think about a peaceful place.  What are some ways I can prevent weight gain? Be aware that many people gain weight after they quit smoking. However, not everyone does. To keep from gaining weight, have a plan in place before you quit and stick to the plan after you quit. Your plan should include:  Having healthy snacks. When you have a craving, it may help to: ? Eat plain popcorn, crunchy carrots, celery, or other cut vegetables. ? Chew sugar-free gum.  Changing how you eat: ? Eat small portion sizes at meals. ? Eat 4-6 small meals throughout the day instead of 1-2 large  meals a day. ? Be mindful when you eat. Do not watch television or do other things that might distract you as you eat.  Exercising regularly: ? Make time to exercise each day. If you do not have time for a long workout, do short bouts of exercise for 5-10 minutes several times a day. ? Do some form of strengthening exercise, like weight lifting, and some form of aerobic exercise, like running or swimming.  Drinking plenty of water or other low-calorie or no-calorie drinks. Drink 6-8 glasses of water daily, or as much as instructed by your health care provider.  Summary  Quitting smoking is a physical and mental challenge. You will face cravings, withdrawal symptoms, and temptation to smoke again. Preparation can help you as you go through these challenges.  You can cope with cravings by keeping your mouth busy (such as by chewing gum), keeping your body and hands busy, and making calls to family, friends, or a helpline for people who want to quit  smoking.  You can cope with withdrawal symptoms by avoiding places where people smoke, avoiding drinks with caffeine, and getting plenty of rest.  Ask your health care provider about the different ways to prevent weight gain, avoid stress, and handle social situations. This information is not intended to replace advice given to you by your health care provider. Make sure you discuss any questions you have with your health care provider. Document Released: 01/02/2016 Document Revised: 01/02/2016 Document Reviewed: 01/02/2016 Elsevier Interactive Patient Education  [employer]2018 Elsevier Inc.

## 2017-03-29 NOTE — Progress Notes (Signed)
Name: Martin Cook   MRN: [id number]    DOB: [birth year]   Date:03/29/2017       Progress Note  Subjective  Chief Complaint  Chief Complaint  Patient presents with  . Generalized Body Aches    cough, no appetite, for 1 week  . Hypertension    discuss recall    HPI  PT presents with one week of body aches, some shortness of breath (has improved today), decreased appetite, did have a deep cough that has since been improving, diarrhea.  Denies chest pain, fevers/chills, N/V.  He states that he is actually feeling a little better this morning. - He is a smoker - has not smoked since becoming sick 1 week ago and says he feels like he is ready to quit completely.  We discussed strategies to remain away from cigarettes,  Patient Active Problem List   Diagnosis Date Noted  . Wheezing on auscultation 04/07/2016  . Cough 04/07/2016  . Annual physical exam 02/06/2016  . Colon cancer screening 02/06/2016  . Hyperglycemia 12/15/2015  . Current tobacco use 12/10/2014  . Hypertension 09/09/2014  . GERD (gastroesophageal reflux disease) 09/09/2014  . Hyperlipidemia 09/09/2014    Social History   Tobacco Use  . Smoking status: Current Every Day Smoker    Packs/day: 1.00    Types: Cigarettes  . Smokeless tobacco: Never Used  Substance Use Topics  . Alcohol use: No    Alcohol/week: 0.0 oz    Comment: occasional     Current Outpatient Medications:  .  albuterol (PROVENTIL HFA;VENTOLIN HFA) 108 (90 Base) MCG/ACT inhaler, Inhale 2 puffs into the lungs every 6 (six) hours as needed for wheezing or shortness of breath., Disp: 1 Inhaler, Rfl: 2 .  amLODipine (NORVASC) 2.5 MG tablet, Take 1 tablet (2.5 mg total) by mouth daily., Disp: 90 tablet, Rfl: 0 .  omeprazole (PRILOSEC) 20 MG capsule, Take 1 capsule (20 mg total) by mouth daily., Disp: 90 capsule, Rfl: 0 .  rosuvastatin (CRESTOR) 40 MG tablet, Take 1 tablet (40 mg total) by mouth daily., Disp: 90 tablet, Rfl: 0 .  Vitamin D,  Ergocalciferol, (DRISDOL) 50000 units CAPS capsule, Take 1 capsule (50,000 Units total) by mouth every 7 (seven) days. For 12 weeks, Disp: 12 capsule, Rfl: 0  No Known Allergies  ROS  Ten systems reviewed and is negative except as mentioned in HPI  Objective  Vitals:   03/29/17 0903  BP: 130/80  Resp: 18  Temp: 98.2 F (36.8 C)  TempSrc: Oral  SpO2: 94%  Weight: 179 lb (81.2 kg)  Height: 5\' 7"  (1.702 m)   Body mass index is 28.04 kg/m.  Nursing Note and Vital Signs reviewed.  Physical Exam  Constitutional: Patient appears well-developed and well-nourished. Obese. No distress.  HEENT: head atraumatic, normocephalic, pupils equal and reactive to light, EOM's intact, TM's without erythema or bulging, no maxillary or frontal sinus tenderness, neck supple without lymphadenopathy, oropharynx pink and moist without exudate Cardiovascular: Normal rate, regular rhythm, S1/S2 present.  No murmur or rub heard. No BLE edema. Pulmonary/Chest: Effort normal and breath sounds slightly diminished with expiratory wheezes in the RIGHT base only. No respiratory distress or retractions. Psychiatric: Patient has a normal mood and affect. behavior is normal. Judgment and thought content normal.  No results found for this or any previous visit (from the past 72 hour(s)).  Assessment & Plan  1. Community acquired pneumonia of right lower lobe of lung (HCC) - doxycycline (VIBRA-TABS) 100 MG tablet; Take  1 tablet (100 mg total) by mouth 2 (two) times daily for 7 days.  Dispense: 14 tablet; Refill: 0 - Advised we will treat for CAP as pt does have adventitious lung sounds; advised if not improving in 3-5 days needs to return for further evaluation.  2. Cough - benzonatate (TESSALON) 100 MG capsule; Take 1 capsule (100 mg total) by mouth 2 (two) times daily as needed for cough.  Dispense: 20 capsule; Refill: 0  3. Current tobacco use - Cessation is discussed in detail.  4. Wheezing on  auscultation - Chronic issue, declines repeat chest Xray today.  -Red flags and when to present for emergency care or RTC including fever >101.26F, chest pain, shortness of breath, new/worsening/un-resolving symptoms, reviewed with patient at time of visit. Follow up and care instructions discussed and provided in AVS.

## 2017-05-11 ENCOUNTER — Other Ambulatory Visit: Payer: Self-pay

## 2017-05-11 DIAGNOSIS — I10 Essential (primary) hypertension: Secondary | ICD-10-CM

## 2017-05-11 MED ORDER — AMLODIPINE BESYLATE 2.5 MG PO TABS: 2.5000 mg | ORAL_TABLET | Freq: Every day | ORAL | 0 refills | Status: DC

## 2017-05-11 NOTE — Telephone Encounter (Signed)
Reviewed last two visits, last set of labs Patient needs an appointment with me please for vit D deficiency, prediabetes, cholesterol, and pneumonia follow-up Will also need to be screened for lung cancer, need pack years in social history I'll send in Rx for BP, as last was controlled ----------------------------- Cassandra, please ask pt to schedule visit for above issues

## 2017-05-12 NOTE — Telephone Encounter (Signed)
[id number]  lvm on [phone] @ 8:30 informing that prescription has been sent to pharmacy however pt is needing to sch follow up appt before the next refill. Pt has option to see Martin Cook

## 2017-06-06 ENCOUNTER — Other Ambulatory Visit: Payer: Self-pay

## 2017-06-06 DIAGNOSIS — E7800 Pure hypercholesterolemia, unspecified: Secondary | ICD-10-CM

## 2017-06-06 MED ORDER — ROSUVASTATIN CALCIUM 40 MG PO TABS: 40.0000 mg | ORAL_TABLET | Freq: Every day | ORAL | 0 refills | Status: DC

## 2017-06-06 NOTE — Telephone Encounter (Signed)
Patient has an appt with me on June 4th; last SGPT normal, but in Nov 2017; will check at f/u

## 2017-06-12 ENCOUNTER — Other Ambulatory Visit: Payer: Self-pay | Admitting: Family Medicine

## 2017-06-21 ENCOUNTER — Encounter: Payer: Self-pay | Admitting: Family Medicine

## 2017-06-21 ENCOUNTER — Ambulatory Visit: Payer: BLUE CROSS/BLUE SHIELD | Admitting: Family Medicine

## 2017-06-21 ENCOUNTER — Telehealth: Payer: Self-pay | Admitting: *Deleted

## 2017-06-21 VITALS — BP 138/82 | HR 87 | Temp 97.6°F | Resp 14 | Ht 67.0 in | Wt 172.9 lb

## 2017-06-21 DIAGNOSIS — Z122 Encounter for screening for malignant neoplasm of respiratory organs: Secondary | ICD-10-CM

## 2017-06-21 DIAGNOSIS — Z87891 Personal history of nicotine dependence: Secondary | ICD-10-CM

## 2017-06-21 DIAGNOSIS — Z720 Tobacco use: Secondary | ICD-10-CM

## 2017-06-21 DIAGNOSIS — E782 Mixed hyperlipidemia: Secondary | ICD-10-CM

## 2017-06-21 DIAGNOSIS — K219 Gastro-esophageal reflux disease without esophagitis: Secondary | ICD-10-CM

## 2017-06-21 DIAGNOSIS — R7303 Prediabetes: Secondary | ICD-10-CM

## 2017-06-21 DIAGNOSIS — I10 Essential (primary) hypertension: Secondary | ICD-10-CM

## 2017-06-21 DIAGNOSIS — Z2821 Immunization not carried out because of patient refusal: Secondary | ICD-10-CM | POA: Diagnosis not present

## 2017-06-21 DIAGNOSIS — Z5181 Encounter for therapeutic drug level monitoring: Secondary | ICD-10-CM | POA: Diagnosis not present

## 2017-06-21 NOTE — Assessment & Plan Note (Signed)
Check fasting lipids; continue statin, limit saturated fats

## 2017-06-21 NOTE — Assessment & Plan Note (Signed)
Check liver and kidney function 

## 2017-06-21 NOTE — Assessment & Plan Note (Signed)
Check glucose and A1c 

## 2017-06-21 NOTE — Assessment & Plan Note (Signed)
He is not ready to quit; I am here to help if/when he is ready

## 2017-06-21 NOTE — Telephone Encounter (Signed)
Received referral for initial lung cancer screening scan. Contacted patient and obtained smoking history,(current, 43 pack year) as well as answering questions related to screening process. Patient denies signs of lung cancer such as weight loss or hemoptysis. Patient denies comorbidity that would prevent curative treatment if lung cancer were found. Patient is scheduled for shared decision making visit and CT scan on 07/05/17.

## 2017-06-21 NOTE — Progress Notes (Signed)
BP 138/82   Pulse 87   Temp 97.6 F (36.4 C) (Oral)   Resp 14   Ht 5\' 7"  (1.702 m)   Wt 172 lb 14.4 oz (78.4 kg)   SpO2 93%   BMI 27.08 kg/m    Subjective:    Patient ID: Martin Cook, male    DOB: 27-Aug-1960, 57 y.o.   MRN: [id number]  HPI: Martin Cook is a 57 y.o. male  Chief Complaint  Patient presents with  . 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 No No No No No   Functional Status Survey: Is the patient deaf or have difficulty hearing?: No Does the patient have difficulty seeing, even when wearing glasses/contacts?: No Does the patient have difficulty concentrating, remembering, or making decisions?: No Does the patient have difficulty walking or climbing stairs?: No Does the patient have difficulty dressing or bathing?: No Does the patient have difficulty doing errands alone such as visiting a doctor's office or shopping?: No  Depression screen [facility] 2/9 06/21/2017 02/16/2017 12/15/2016 09/03/2016 06/03/2016  Decreased Interest 0 0 0 0 0  Down, Depressed, Hopeless 0 0 0 0 0  PHQ - 2 Score 0 0 0 0 0   Relevant past medical, surgical, family and social history reviewed Past Medical History:  Diagnosis Date  .  GERD (gastroesophageal reflux disease)   . Hyperlipidemia   . Hypertension    History reviewed. No pertinent surgical history. Family History  Problem Relation Age of Onset  . Stroke Mother   . Stroke Father   . Deep vein thrombosis Father   . Cancer Brother   . Stroke Maternal Grandfather        Pt is unsure    Social History   Tobacco Use  . Smoking status: Current Every Day Smoker    Packs/day: 1.00    Types: Cigarettes  . Smokeless tobacco: Never Used  Substance Use Topics  . Alcohol use: No    Alcohol/week: 0.0 oz    Comment: occasional  . Drug use: No    Interim medical history since last visit reviewed. Allergies and medications reviewed  Review of Systems Per HPI unless specifically indicated above     Objective:    BP 138/82   Pulse 87   Temp 97.6 F (36.4 C) (Oral)   Resp 14   Ht 5\' 7"  (1.702 m)   Wt 172 lb 14.4 oz (78.4 kg)   SpO2 93%   BMI 27.08 kg/m   Wt Readings from Last 3 Encounters:  06/21/17 172 lb 14.4 oz (78.4 kg)  03/29/17 179 lb (81.2 kg)  02/16/17 183  lb (83 kg)    Physical Exam  Constitutional: He appears well-developed and well-nourished. No distress.  HENT:  Head: Normocephalic and atraumatic.  Eyes: EOM are normal. No scleral icterus.  Neck: No thyromegaly present.  Cardiovascular: Normal rate and regular rhythm.  Pulmonary/Chest: Effort normal and breath sounds normal.  Abdominal: Soft. Bowel sounds are normal. He exhibits no distension.  Musculoskeletal: He exhibits no edema.  Neurological: Coordination normal.  Skin: Skin is warm and dry. No pallor.  Psychiatric: He has a normal mood and affect. His behavior is normal. Judgment and thought content normal.    Results for orders placed or performed in visit on 02/16/17  CBC with Differential/Platelet  Result Value Ref Range   WBC 5.4 3.8 - 10.8 Thousand/uL   RBC 5.20 4.20 - 5.80 Million/uL   Hemoglobin 16.0 13.2 - 17.1 g/dL   HCT 96.045.8 45.438.5 - 09.850.0 %   MCV 88.1 80.0 -  100.0 fL   MCH 30.8 27.0 - 33.0 pg   MCHC 34.9 32.0 - 36.0 g/dL   RDW 11.913.1 14.711.0 - 82.915.0 %   Platelets 213 140 - 400 Thousand/uL   MPV 9.9 7.5 - 12.5 fL   Neutro Abs 3,332 1,500 - 7,800 cells/uL   Lymphs Abs 1,528 850 - 3,900 cells/uL   WBC mixed population 410 200 - 950 cells/uL   Eosinophils Absolute 92 15 - 500 cells/uL   Basophils Absolute 38 0 - 200 cells/uL   Neutrophils Relative % 61.7 %   Total Lymphocyte 28.3 %   Monocytes Relative 7.6 %   Eosinophils Relative 1.7 %   Basophils Relative 0.7 %  TSH  Result Value Ref Range   TSH 1.50 0.40 - 4.50 mIU/L  VITAMIN D 25 Hydroxy (Vit-D Deficiency, Fractures)  Result Value Ref Range   Vit D, 25-Hydroxy 17 (L) 30 - 100 ng/mL  Hepatitis C antibody  Result Value Ref Range   Hepatitis C Ab NON-REACTIVE NON-REACTI   SIGNAL TO CUT-OFF 0.01 <1.00  PSA  Result Value Ref Range   PSA 0.3 < OR = 4.0 ng/mL  Hemoglobin A1c  Result Value Ref Range   Hgb A1c MFr Bld 5.8 (H) <5.7 % of total Hgb   Mean Plasma Glucose 120 (calc)   eAG (mmol/L) 6.6 (calc)      Assessment & Plan:   Problem List Items Addressed This Visit      Cardiovascular and Mediastinum   Hypertension - Primary    Continue current med; limit salt        Digestive   GERD (gastroesophageal reflux disease)    Controlled; no red flags        Other   Prediabetes    Check glucose and A1c      Relevant Orders   Hemoglobin A1c   Medication monitoring encounter    Check liver and kidney function      Relevant Orders   COMPLETE METABOLIC PANEL WITH GFR   Hyperlipidemia    Check fasting lipids; continue statin, limit saturated fats      Relevant Orders   Lipid panel   Current tobacco use    He is not ready to quit; I am here to help if/when he is ready       Other Visit Diagnoses    Tobacco abuse       Relevant Orders   CT CHEST LUNG CA SCREEN LOW DOSE W/O CM   Pneumococcal vaccination declined       offered to  patient and refused       Follow  up plan: Return in about 6 months (around 12/21/2017) for follow-up visit with Dr. Sherie Don.  An after-visit summary was printed and given to the patient at check-out.  Please see the patient instructions which may contain other information and recommendations beyond what is mentioned above in the assessment and plan.  No orders of the defined types were placed in this encounter.   Orders Placed This Encounter  Procedures  . CT CHEST LUNG CA SCREEN LOW DOSE W/O CM  . Hemoglobin A1c  . COMPLETE METABOLIC PANEL WITH GFR  . Lipid panel

## 2017-06-21 NOTE — Assessment & Plan Note (Signed)
Controlled; no red flags

## 2017-06-21 NOTE — Assessment & Plan Note (Signed)
Continue current med; limit salt

## 2017-06-21 NOTE — Patient Instructions (Signed)
I do encourage you to quit smoking Call [phone] to sign up for smoking cessation classes You can call 1-800-QUIT-NOW to talk with a smoking cessation coach  Try to follow the DASH guidelines (DASH stands for Dietary Approaches to Stop Hypertension). Try to limit the sodium in your diet to no more than 1,500mg of sodium per day. Certainly try to not exceed 2,000 mg per day at the very most. Do not add salt when cooking or at the table.  Check the sodium amount on labels when shopping, and choose items lower in sodium when given a choice. Avoid or limit foods that already contain a lot of sodium. Eat a diet rich in fruits and vegetables and whole grains, and try to lose weight if overweight or obese  Try to limit saturated fats in your diet (bologna, hot dogs, barbeque, cheeseburgers, hamburgers, steak, bacon, sausage, cheese, etc.) and get more fresh fruits, vegetables, and whole grains  

## 2017-06-22 LAB — LIPID PANEL
Cholesterol: 190 mg/dL (ref <200)
HDL: 63 mg/dL (ref >40)
LDL CHOLESTEROL (CALC): 98 mg/dL
NON-HDL CHOLESTEROL (CALC): 127 mg/dL (ref <130)
Total CHOL/HDL Ratio: 3 (calc) (ref <5.0)
Triglycerides: 203 mg/dL — ABNORMAL HIGH (ref <150)

## 2017-06-22 LAB — COMPLETE METABOLIC PANEL WITH GFR
AG Ratio: 1.9 (calc) (ref 1.0–2.5)
ALBUMIN MSPROF: 4.6 g/dL (ref 3.6–5.1)
ALT: 20 U/L (ref 9–46)
AST: 21 U/L (ref 10–35)
Alkaline phosphatase (APISO): 66 U/L (ref 40–115)
BILIRUBIN TOTAL: 1.1 mg/dL (ref 0.2–1.2)
BUN: 12 mg/dL (ref 7–25)
CO2: 29 mmol/L (ref 20–32)
CREATININE: 0.76 mg/dL (ref 0.70–1.33)
Calcium: 9.7 mg/dL (ref 8.6–10.3)
Chloride: 105 mmol/L (ref 98–110)
GFR, EST AFRICAN AMERICAN: 117 mL/min/{1.73_m2} (ref >=60)
GFR, Est Non African American: 101 mL/min/{1.73_m2} (ref >=60)
GLUCOSE: 100 mg/dL — AB (ref 65–99)
Globulin: 2.4 g/dL (calc) (ref 1.9–3.7)
Potassium: 4 mmol/L (ref 3.5–5.3)
Sodium: 142 mmol/L (ref 135–146)
TOTAL PROTEIN: 7 g/dL (ref 6.1–8.1)

## 2017-06-22 LAB — HEMOGLOBIN A1C
EAG (MMOL/L): 6.6 (calc)
Hgb A1c MFr Bld: 5.8 % of total Hgb — ABNORMAL HIGH (ref <5.7)
MEAN PLASMA GLUCOSE: 120 (calc)

## 2017-07-04 ENCOUNTER — Telehealth: Payer: Self-pay | Admitting: Nurse Practitioner

## 2017-07-05 ENCOUNTER — Inpatient Hospital Stay: Payer: BLUE CROSS/BLUE SHIELD | Attending: Nurse Practitioner | Admitting: Nurse Practitioner

## 2017-07-05 ENCOUNTER — Encounter: Payer: Self-pay | Admitting: Nurse Practitioner

## 2017-07-05 ENCOUNTER — Ambulatory Visit
Admission: RE | Admit: 2017-07-05 | Discharge: 2017-07-05 | Disposition: A | Discharge status: Home / Self Care | Payer: BLUE CROSS/BLUE SHIELD | Source: Ambulatory Visit | Attending: Nurse Practitioner | Admitting: Nurse Practitioner

## 2017-07-05 DIAGNOSIS — F17210 Nicotine dependence, cigarettes, uncomplicated: Secondary | ICD-10-CM | POA: Diagnosis not present

## 2017-07-05 DIAGNOSIS — I2510 Atherosclerotic heart disease of native coronary artery without angina pectoris: Secondary | ICD-10-CM | POA: Insufficient documentation

## 2017-07-05 DIAGNOSIS — Z122 Encounter for screening for malignant neoplasm of respiratory organs: Secondary | ICD-10-CM | POA: Diagnosis not present

## 2017-07-05 DIAGNOSIS — Z87891 Personal history of nicotine dependence: Secondary | ICD-10-CM

## 2017-07-05 DIAGNOSIS — K449 Diaphragmatic hernia without obstruction or gangrene: Secondary | ICD-10-CM | POA: Diagnosis not present

## 2017-07-05 DIAGNOSIS — I700 Atherosclerosis of aorta: Secondary | ICD-10-CM | POA: Insufficient documentation

## 2017-07-05 DIAGNOSIS — J439 Emphysema, unspecified: Secondary | ICD-10-CM | POA: Insufficient documentation

## 2017-07-05 NOTE — Progress Notes (Signed)
In accordance with CMS guidelines, patient has met eligibility criteria including age, absence of signs or symptoms of lung cancer.  Social History   Tobacco Use  . Smoking status: Current Every Day Smoker    Packs/day: 1.00    Years: 43.00    Pack years: 43.00    Types: Cigarettes  . Smokeless tobacco: Never Used  Substance Use Topics  . Alcohol use: No    Alcohol/week: 0.0 oz    Comment: occasional  . Drug use: No      A shared decision-making session was conducted prior to the performance of CT scan. This includes one or more decision aids, includes benefits and harms of screening, follow-up diagnostic testing, over-diagnosis, false positive rate, and total radiation exposure.   Counseling on the importance of adherence to annual lung cancer LDCT screening, impact of co-morbidities, and ability or willingness to undergo diagnosis and treatment is imperative for compliance of the program.   Counseling on the importance of continued smoking cessation for former smokers; the importance of smoking cessation for current smokers, and information about tobacco cessation interventions have been given to patient including [place] and [facility] quit [place] programs.   Written order for lung cancer screening with LDCT has been given to the patient and any and all questions have been answered to the best of my abilities.    Yearly follow up will be coordinated by Burgess Estelle, Thoracic Navigator.  Beckey Rutter, DNP, AGNP-C [place] [facility] [phone] (work cell) [phone] (office) 07/05/17 2:46 PM

## 2017-07-07 ENCOUNTER — Encounter: Payer: Self-pay | Admitting: *Deleted

## 2017-07-13 ENCOUNTER — Telehealth: Payer: Self-pay | Admitting: Family Medicine

## 2017-07-13 NOTE — Telephone Encounter (Signed)
Please ask patient to schedule an appointment to go over the results of his CT scan; he had abnormal findings; thank you Ask if he is having any episodes of chest pain; if so, direct him to go to the ER if recurs Instruct him to take an 81 mg aspirin daily; add to med list Thank you  

## 2017-07-14 ENCOUNTER — Other Ambulatory Visit: Payer: Self-pay

## 2017-07-14 NOTE — Telephone Encounter (Signed)
Left detailed voicemail

## 2017-08-01 ENCOUNTER — Telehealth: Payer: Self-pay | Admitting: Family Medicine

## 2017-08-01 NOTE — Telephone Encounter (Unsigned)
Copied from CRM [phone]. Topic: Quick Communication - See Telephone Encounter >> Aug 01, 2017  5:01 PM Floria RavelingStovall, Shana A wrote: CRM for notification. See Telephone encounter for: 08/01/17.  Pt called in and stated that he visit he had on 6/18 for the [facility] [1512] was suppose to be covered.  He stated he how has gotten a bill for it for $202.97.  He is wanting to know why he owes this?    Best number - [phone]

## 2017-08-02 NOTE — Telephone Encounter (Signed)
LVM for pt to call the Lung office to get explanation as to why he has this bill.

## 2017-08-11 ENCOUNTER — Other Ambulatory Visit: Payer: Self-pay | Admitting: Family Medicine

## 2017-08-11 DIAGNOSIS — I10 Essential (primary) hypertension: Secondary | ICD-10-CM

## 2017-09-01 ENCOUNTER — Other Ambulatory Visit: Payer: Self-pay | Admitting: Family Medicine

## 2017-09-01 DIAGNOSIS — E7800 Pure hypercholesterolemia, unspecified: Secondary | ICD-10-CM

## 2017-09-02 MED ORDER — RANITIDINE HCL 300 MG PO TABS: 300.0000 mg | ORAL_TABLET | Freq: Every day | ORAL | 1 refills | Status: DC

## 2017-09-02 NOTE — Telephone Encounter (Signed)
He has been on it for quite a few years now. He is unsure exactly how long. He will pick up the Zantac and start taking that instead and update you if symptoms return.

## 2017-09-02 NOTE — Telephone Encounter (Signed)
Patient requested another refill of PPI Please contact him and find out how long he's been on this medicine Long-term use of this medicine is associated with serious consequences (anemia, osteoporosis, C diff colitis, pneumonia, heart issues and kidney issues) I'll recommend that he stop the PPI unless her has Barrett's or a GI doctor I'll send in new Rx to use instead with less risk If symptoms return, have him contact us and we'll have him see a GI doctor

## 2018-02-22 ENCOUNTER — Telehealth: Payer: Self-pay | Admitting: Family Medicine

## 2018-02-22 DIAGNOSIS — E7800 Pure hypercholesterolemia, unspecified: Secondary | ICD-10-CM

## 2018-02-22 DIAGNOSIS — I10 Essential (primary) hypertension: Secondary | ICD-10-CM

## 2018-02-22 NOTE — Telephone Encounter (Signed)
Patient was due for an appointment two months ago (around December 4th) Please ask him to schedule a visit soon I'll send in refills and we look forward to helping him with his health Thank you

## 2018-02-23 NOTE — Telephone Encounter (Signed)
Left message informed pt that prescriptions have been sent to pharmacy. Asked pt to return call to schedule appt

## 2018-05-16 ENCOUNTER — Telehealth: Payer: Self-pay | Admitting: Family Medicine

## 2018-05-16 DIAGNOSIS — E7800 Pure hypercholesterolemia, unspecified: Secondary | ICD-10-CM

## 2018-05-16 DIAGNOSIS — I10 Essential (primary) hypertension: Secondary | ICD-10-CM

## 2018-05-16 NOTE — Telephone Encounter (Signed)
lvm informing prescription has been sent to pharmacy, appt scheduled

## 2018-05-16 NOTE — Telephone Encounter (Signed)
10-day supply has been given, needs appt.

## 2018-05-22 ENCOUNTER — Other Ambulatory Visit: Payer: Self-pay

## 2018-05-22 ENCOUNTER — Encounter: Payer: Self-pay | Admitting: Family Medicine

## 2018-05-22 ENCOUNTER — Ambulatory Visit (INDEPENDENT_AMBULATORY_CARE_PROVIDER_SITE_OTHER): Payer: BLUE CROSS/BLUE SHIELD | Admitting: Family Medicine

## 2018-05-22 DIAGNOSIS — I700 Atherosclerosis of aorta: Secondary | ICD-10-CM

## 2018-05-22 DIAGNOSIS — E7800 Pure hypercholesterolemia, unspecified: Secondary | ICD-10-CM

## 2018-05-22 DIAGNOSIS — Z720 Tobacco use: Secondary | ICD-10-CM

## 2018-05-22 DIAGNOSIS — R7303 Prediabetes: Secondary | ICD-10-CM

## 2018-05-22 DIAGNOSIS — Z122 Encounter for screening for malignant neoplasm of respiratory organs: Secondary | ICD-10-CM

## 2018-05-22 DIAGNOSIS — E559 Vitamin D deficiency, unspecified: Secondary | ICD-10-CM

## 2018-05-22 DIAGNOSIS — I10 Essential (primary) hypertension: Secondary | ICD-10-CM

## 2018-05-22 DIAGNOSIS — K219 Gastro-esophageal reflux disease without esophagitis: Secondary | ICD-10-CM

## 2018-05-22 MED ORDER — FAMOTIDINE 40 MG PO TABS: 40.0000 mg | ORAL_TABLET | Freq: Every day | ORAL | 3 refills | Status: DC

## 2018-05-22 MED ORDER — ROSUVASTATIN CALCIUM 40 MG PO TABS: 40.0000 mg | ORAL_TABLET | Freq: Every day | ORAL | 3 refills | Status: DC

## 2018-05-22 MED ORDER — AMLODIPINE BESYLATE 2.5 MG PO TABS: 2.5000 mg | ORAL_TABLET | Freq: Every day | ORAL | 3 refills | Status: DC

## 2018-05-22 NOTE — Progress Notes (Signed)
Name: Martin Cook   MRN: [id number]    DOB: Aug 13, 1960   Date:05/22/2018       Progress Note  Subjective  Chief Complaint  Chief Complaint  Patient presents with  . Follow-up    I connected with  Martin Cook  on 05/22/18 at 12:40 PM EDT by a video enabled telemedicine application and verified that I am speaking with the correct person using two identifiers.  I discussed the limitations of evaluation and management by telemedicine and the availability of in person appointments. The patient expressed understanding and agreed to proceed. Staff also discussed with the patient that there may be a patient responsible charge related to this service. Patient Location: Home Provider Location: Home Additional Individuals present: None  HPI  HLD/Aortic Atherosclerosis: No chest pain, shortness of breath, myaglias.  Doing well on crestor . We will recheck in 1 month when he is due.  Has not been taking  ASA, but recommended he restart.  High blood pressure; taknig same dose of amlodipine; very little salt added to food. Denies chest pain, shortness of breath, BLE edema, blurred vision.  Doing well on current dose; due for CMP repeat in 1 month.  Prediabetes; last A1c was 5.8; no diabetes in the family; denies polyphagia, no polydipsia, or polyuria.  Drinks a lot of sweet tea.   GERD; controlled with ranitidine - advised on recall, will switch to famotidine; no blood in the stool, regurgitation, no difficulty swallowing.   Vit D: was 17 in 2019 so he took the once a week vit D pill and completed this course, not taking OTC supplement.   Tobacco abuse: 1ppd still; 31 pack years; he breathes in and out okay; still pretty active; he has tried to quit in the pas.  Denies shortness of breath or audible wheezing.    Patient Active Problem List   Diagnosis Date Noted  . Prediabetes 06/21/2017  . Medication monitoring encounter 06/21/2017  . ED (erectile dysfunction) of organic  origin 03/29/2017  . Wheezing on auscultation 04/07/2016  . Cough 04/07/2016  . Annual physical exam 02/06/2016  . Colon cancer screening 02/06/2016  . Hyperglycemia 12/15/2015  . Current tobacco use 12/10/2014  . Hypertension 09/09/2014  . GERD (gastroesophageal reflux disease) 09/09/2014  . Hyperlipidemia 09/09/2014    History reviewed. No pertinent surgical history.  Family History  Problem Relation Age of Onset  . Stroke Mother   . Stroke Father   . Deep vein thrombosis Father   . Cancer Brother        liver  . Stroke Maternal Grandfather        Pt is unsure     Social History   Socioeconomic History  . Marital status: Married    Spouse name: Not on file  . Number of children: Not on file  . Years of education: Not on file  . Highest education level: Not on file  Occupational History  . Not on file  Social Needs  . Financial resource strain: Not on file  . Food insecurity:    Worry: Not on file    Inability: Not on file  . Transportation needs:    Medical: Not on file    Non-medical: Not on file  Tobacco Use  . Smoking status: Current Every Day Smoker    Packs/day: 1.00    Years: 43.00    Pack years: 43.00    Types: Cigarettes  . Smokeless tobacco: Never Used  Substance and Sexual Activity  .  Alcohol use: No    Alcohol/week: 0.0 standard drinks    Comment: occasional  . Drug use: No  . Sexual activity: Yes  Lifestyle  . Physical activity:    Days per week: Not on file    Minutes per session: Not on file  . Stress: Not on file  Relationships  . Social connections:    Talks on phone: Not on file    Gets together: Not on file    Attends religious service: Not on file    Active member of club or organization: Not on file    Attends meetings of clubs or organizations: Not on file    Relationship status: Not on file  . Intimate partner violence:    Fear of current or ex partner: Not on file    Emotionally abused: Not on file    Physically abused:  Not on file    Forced sexual activity: Not on file  Other Topics Concern  . Not on file  Social History Narrative  . Not on file     Current Outpatient Medications:  .  amLODipine (NORVASC) 2.5 MG tablet, TAKE 1 TABLET BY MOUTH EVERY DAY, Disp: 10 tablet, Rfl: 0 .  ranitidine (ZANTAC) 300 MG tablet, TAKE 1 TABLET BY MOUTH EVERYDAY AT BEDTIME, Disp: 90 tablet, Rfl: 0 .  rosuvastatin (CRESTOR) 40 MG tablet, TAKE 1 TABLET BY MOUTH EVERY DAY, Disp: 10 tablet, Rfl: 0 .  albuterol (PROVENTIL HFA;VENTOLIN HFA) 108 (90 Base) MCG/ACT inhaler, Inhale 2 puffs into the lungs every 6 (six) hours as needed for wheezing or shortness of breath. (Patient not taking: Reported on 06/21/2017), Disp: 1 Inhaler, Rfl: 2 .  aspirin EC 81 MG tablet, Take 81 mg by mouth daily., Disp: , Rfl:   No Known Allergies  I personally reviewed active problem list, medication list, allergies, notes from last encounter, lab results with the patient/caregiver today.   ROS Constitutional: Negative for fever or weight change.  Respiratory: Negative for cough and shortness of breath.   Cardiovascular: Negative for chest pain or palpitations.  Gastrointestinal: Negative for abdominal pain, no bowel changes.  Musculoskeletal: Negative for gait problem or joint swelling.  Skin: Negative for rash.  Neurological: Negative for dizziness or headache.  No other specific complaints in a complete review of systems (except as listed in HPI above).  Objective  Virtual encounter, vitals not obtained.  There is no height or weight on file to calculate BMI.  Physical Exam Constitutional: Patient appears well-developed and well-nourished. No distress.  HENT: Head: Normocephalic and atraumatic.  Neck: Normal range of motion. Pulmonary/Chest: Effort normal. No respiratory distress. Speaking in complete sentences Neurological: Pt is alert and oriented to person, place, and time. Coordination, speech are normal.  Psychiatric: Patient  has a normal mood and affect. behavior is normal. Judgment and thought content normal.  No results found for this or any previous visit (from the past 72 hour(s)).  PHQ2/9: Depression screen [facility] 2/9 05/22/2018 06/21/2017 02/16/2017 12/15/2016 09/03/2016  Decreased Interest 0 0 0 0 0  Down, Depressed, Hopeless 0 0 0 0 0  PHQ - 2 Score 0 0 0 0 0  Altered sleeping 0 - - - -  Tired, decreased energy 0 - - - -  Change in appetite 0 - - - -  Feeling bad or failure about yourself  0 - - - -  Trouble concentrating 0 - - - -  Moving slowly or fidgety/restless 0 - - - -  Suicidal  thoughts 0 - - - -  PHQ-9 Score 0 - - - -  Difficult doing work/chores Not difficult at all - - - -   PHQ-2/9 Result is negative.    Fall Risk: Fall Risk  06/21/2017 02/16/2017 12/15/2016 09/03/2016 06/03/2016  Falls in the past year? No No No No No    Assessment & Plan  1. Pure hypercholesterolemia - rosuvastatin (CRESTOR) 40 MG tablet; Take 1 tablet (40 mg total) by mouth daily.  Dispense: 90 tablet; Refill: 3  2. Essential hypertension - DASH diet reinforced - COMPLETE METABOLIC PANEL WITH GFR - amLODipine (NORVASC) 2.5 MG tablet; Take 1 tablet (2.5 mg total) by mouth daily.  Dispense: 90 tablet; Refill: 3  3. Gastroesophageal reflux disease, esophagitis presence not specified - Avoid triggers; doing well - famotidine (PEPCID) 40 MG tablet; Take 1 tablet (40 mg total) by mouth at bedtime.  Dispense: 90 tablet; Refill: 3  4. Tobacco abuse - Cessation encouraged  5. Prediabetes - Recommend cutting back/stopping sweet tea - COMPLETE METABOLIC PANEL WITH GFR - Hemoglobin A1c  6. Vitamin D deficiency - VITAMIN D 25 Hydroxy (Vit-D Deficiency, Fractures)  7. Aortic atherosclerosis (HCC) - Discussed restarting 81mg  ASA - Lipid panel  8. Encounter for screening for malignant neoplasm of respiratory organs - CT CHEST LUNG CA SCREEN LOW DOSE W/O CM; Future   I discussed the assessment and treatment plan with  the patient. The patient was provided an opportunity to ask questions and all were answered. The patient agreed with the plan and demonstrated an understanding of the instructions.  The patient was advised to call back or seek an in-person evaluation if the symptoms worsen or if the condition fails to improve as anticipated.  I provided 27 minutes of non-face-to-face time during this encounter.

## 2018-06-22 ENCOUNTER — Ambulatory Visit: Payer: BC Managed Care – PPO

## 2018-06-22 ENCOUNTER — Telehealth: Payer: Self-pay

## 2018-06-22 DIAGNOSIS — I700 Atherosclerosis of aorta: Secondary | ICD-10-CM | POA: Diagnosis not present

## 2018-06-22 DIAGNOSIS — I10 Essential (primary) hypertension: Secondary | ICD-10-CM | POA: Diagnosis not present

## 2018-06-22 DIAGNOSIS — R7303 Prediabetes: Secondary | ICD-10-CM | POA: Diagnosis not present

## 2018-06-22 DIAGNOSIS — E559 Vitamin D deficiency, unspecified: Secondary | ICD-10-CM | POA: Diagnosis not present

## 2018-06-22 NOTE — Telephone Encounter (Signed)
PT due for labs as well.  Was in office for this BP, or did he call in with home monitor reading? Thanks!

## 2018-06-22 NOTE — Telephone Encounter (Signed)
In office today and did labs

## 2018-06-22 NOTE — Telephone Encounter (Signed)
Please advise he increase amlodipine to 5mg  and return for BP check in 2 weeks (nurse visit only)

## 2018-06-22 NOTE — Telephone Encounter (Signed)
Bp 142/80

## 2018-06-23 LAB — COMPLETE METABOLIC PANEL WITH GFR
AG Ratio: 1.9 (calc) (ref 1.0–2.5)
ALT: 23 U/L (ref 9–46)
AST: 25 U/L (ref 10–35)
Albumin: 4.6 g/dL (ref 3.6–5.1)
Alkaline phosphatase (APISO): 62 U/L (ref 35–144)
BUN: 14 mg/dL (ref 7–25)
CO2: 29 mmol/L (ref 20–32)
Calcium: 9.5 mg/dL (ref 8.6–10.3)
Chloride: 105 mmol/L (ref 98–110)
Creat: 0.91 mg/dL (ref 0.70–1.33)
GFR, Est African American: 107 mL/min/{1.73_m2} (ref >=60)
GFR, Est Non African American: 92 mL/min/{1.73_m2} (ref >=60)
Globulin: 2.4 g/dL (calc) (ref 1.9–3.7)
Glucose, Bld: 108 mg/dL — ABNORMAL HIGH (ref 65–99)
Potassium: 4.2 mmol/L (ref 3.5–5.3)
Sodium: 142 mmol/L (ref 135–146)
Total Bilirubin: 1.1 mg/dL (ref 0.2–1.2)
Total Protein: 7 g/dL (ref 6.1–8.1)

## 2018-06-23 LAB — HEMOGLOBIN A1C
Hgb A1c MFr Bld: 5.8 % of total Hgb — ABNORMAL HIGH (ref <5.7)
Mean Plasma Glucose: 120 (calc)
eAG (mmol/L): 6.6 (calc)

## 2018-06-23 LAB — LIPID PANEL
Cholesterol: 185 mg/dL (ref <200)
HDL: 72 mg/dL (ref >=40)
LDL Cholesterol (Calc): 90 mg/dL (calc)
Non-HDL Cholesterol (Calc): 113 mg/dL (calc) (ref <130)
Total CHOL/HDL Ratio: 2.6 (calc) (ref <5.0)
Triglycerides: 136 mg/dL (ref <150)

## 2018-06-23 LAB — VITAMIN D 25 HYDROXY (VIT D DEFICIENCY, FRACTURES): Vit D, 25-Hydroxy: 20 ng/mL — ABNORMAL LOW (ref 30–100)

## 2018-06-23 NOTE — Telephone Encounter (Signed)
Left detailed voicemail

## 2018-07-19 ENCOUNTER — Telehealth: Payer: Self-pay | Admitting: *Deleted

## 2018-07-19 NOTE — Telephone Encounter (Signed)
Contacted regarding scheduling lung screening scan. Patient request to wait a month and then call back to consider scan.

## 2018-08-22 ENCOUNTER — Other Ambulatory Visit: Payer: Self-pay | Admitting: Family Medicine

## 2018-08-22 DIAGNOSIS — K219 Gastro-esophageal reflux disease without esophagitis: Secondary | ICD-10-CM

## 2018-08-29 ENCOUNTER — Other Ambulatory Visit: Payer: Self-pay | Admitting: Family Medicine

## 2018-08-29 DIAGNOSIS — K219 Gastro-esophageal reflux disease without esophagitis: Secondary | ICD-10-CM

## 2018-09-26 ENCOUNTER — Telehealth: Payer: Self-pay | Admitting: *Deleted

## 2018-09-26 NOTE — Telephone Encounter (Signed)
Left message for patient to notify them that it is time to schedule annual low dose lung cancer screening CT scan. Instructed patient to call back to verify information prior to the scan being scheduled.  

## 2018-09-26 NOTE — Telephone Encounter (Signed)
Error

## 2018-09-26 NOTE — Telephone Encounter (Signed)
Returned call and reports that he is too busy at this time to have lung screening scan. Reports he will consider in a couple months.

## 2018-11-22 ENCOUNTER — Encounter: Payer: Self-pay | Admitting: Family Medicine

## 2018-11-22 ENCOUNTER — Other Ambulatory Visit: Payer: Self-pay

## 2018-11-22 ENCOUNTER — Ambulatory Visit (INDEPENDENT_AMBULATORY_CARE_PROVIDER_SITE_OTHER): Payer: BC Managed Care – PPO | Admitting: Family Medicine

## 2018-11-22 VITALS — BP 132/84 | HR 81 | Temp 98.1°F | Resp 14 | Ht 67.0 in | Wt 184.9 lb

## 2018-11-22 DIAGNOSIS — I10 Essential (primary) hypertension: Secondary | ICD-10-CM | POA: Diagnosis not present

## 2018-11-22 DIAGNOSIS — J410 Simple chronic bronchitis: Secondary | ICD-10-CM

## 2018-11-22 DIAGNOSIS — E782 Mixed hyperlipidemia: Secondary | ICD-10-CM | POA: Diagnosis not present

## 2018-11-22 DIAGNOSIS — R7303 Prediabetes: Secondary | ICD-10-CM | POA: Diagnosis not present

## 2018-11-22 DIAGNOSIS — Z5181 Encounter for therapeutic drug level monitoring: Secondary | ICD-10-CM

## 2018-11-22 DIAGNOSIS — I700 Atherosclerosis of aorta: Secondary | ICD-10-CM

## 2018-11-22 DIAGNOSIS — K219 Gastro-esophageal reflux disease without esophagitis: Secondary | ICD-10-CM

## 2018-11-22 DIAGNOSIS — Z720 Tobacco use: Secondary | ICD-10-CM

## 2018-11-22 NOTE — Patient Instructions (Signed)
Atherosclerosis  Atherosclerosis is narrowing and hardening of the arteries. Arteries are blood vessels that carry blood from the heart to all parts of the body. This blood contains oxygen. Arteries can become narrow or clogged with a buildup of fat, cholesterol, calcium, and other substances (plaque). Plaque decreases the amount of blood that can flow through the artery. Atherosclerosis can affect any artery in the body, including:  Heart arteries (coronary artery disease). This may cause a heart attack.  Brain arteries. This may cause a stroke (cerebrovascular accident).  Leg, arm, and pelvis arteries (peripheral artery disease). This may cause pain and numbness.  Kidney arteries. This may cause kidney (renal) failure. Treatment may slow the disease and prevent further damage to the heart, brain, peripheral arteries, and kidneys. What are the causes? Atherosclerosis develops slowly over many years. The inner layers of your arteries become damaged and allow the gradual buildup of plaque. The exact cause of atherosclerosis is not fully understood. Symptoms of atherosclerosis do not occur until the artery becomes narrow or blocked. What increases the risk? The following factors may make you more likely to develop this condition:  High blood pressure.  High cholesterol.  Being middle-aged or older.  Having a family history of atherosclerosis.  Having high blood fats (triglycerides).  Diabetes.  Being overweight.  Smoking tobacco.  Not exercising enough (sedentary lifestyle).  Having a substance in the blood called C-reactive protein (CRP). This is a sign of increased levels of inflammation in the body.  Sleep apnea.  Being stressed.  Drinking too much alcohol. What are the signs or symptoms? This condition may not cause any symptoms. If you have symptoms, they are caused by damage to an area of your body that is not getting enough blood.  Coronary artery disease may  cause chest pain and shortness of breath.  Decreased blood supply to your brain may cause a stroke. Signs of a stroke may include sudden: ? Weakness on one side of the body. ? Confusion. ? Changes in vision. ? Inability to speak or understand speech. ? Loss of balance, coordination, or the ability to walk. ? Severe headache. ? Loss of consciousness.  Peripheral arterial disease may cause pain and numbness, often in the legs and hips.  Renal failure may cause fatigue, nausea, swelling, and itchy skin. How is this diagnosed? This condition is diagnosed based on your medical history and a physical exam. During the exam:  Your health care provider will: ? Check your pulse in different places. ? Listen for a "whooshing" sound over your arteries (bruit).  You may have tests, such as: ? Blood tests to check your levels of cholesterol, triglycerides, and CRP. ? Electrocardiogram (ECG) to check for heart damage. ? Chest X-ray to see if you have an enlarged heart, which is a sign of heart failure. ? Stress test to see how your heart reacts to exercise. ? Echocardiogram to get images of the inside of your heart. ? Ankle-brachial index to compare blood pressure in your arms to blood pressure in your ankles. ? Ultrasound of your peripheral arteries to check blood flow. ? CT scan to check for damage to your heart or brain. ? X-rays of blood vessels after dye has been injected (angiogram) to check blood flow. How is this treated? Treatment starts with lifestyle changes, which may include:  Changing your diet.  Losing weight.  Reducing stress.  Exercising and being physically active more regularly.  Not smoking. You may also need medicine to:  Lower triglycerides and cholesterol.  Control blood pressure.  Prevent blood clots.  Lower inflammation in your body.  Control your blood sugar. Sometimes, surgery is needed to:  Remove plaque from an artery (endarterectomy).  Open or  widen a narrowed heart artery (angioplasty).  Create a new path for your blood with one of these procedures: ? Heart (coronary) artery bypass graft surgery. ? Peripheral artery bypass graft surgery. Follow these instructions at home: Eating and drinking   Eat a heart-healthy diet. Talk with your health care provider or a diet and nutrition specialist (dietitian) if you need help. A heart-healthy diet involves: ? Limiting unhealthy fats and increasing healthy fats. Some examples of healthy fats are olive oil and canola oil. ? Eating plant-based foods, such as fruits, vegetables, nuts, whole grains, and legumes (such as peas and lentils).  Limit alcohol intake to no more than 1 drink a day for nonpregnant women and 2 drinks a day for men. One drink equals 12 oz of beer, 5 oz of wine, or 1 oz of hard liquor. Lifestyle  Follow an exercise program as told by your health care provider.  Maintain a healthy weight. Lose weight if your health care provider says that you need to do that.  Rest when you are tired.  Learn to manage your stress.  Do not use any products that contain nicotine or tobacco, such as cigarettes and e-cigarettes. If you need help quitting, ask your health care provider.  Do not abuse drugs. General instructions  Take over-the-counter and prescription medicines only as told by your health care provider.  Manage other health conditions as told by your health care provider.  Keep all follow-up visits as told by your health care provider. This is important. Contact a health care provider if:  You have chest pain or discomfort. This includes squeezing chest pain that may feel like indigestion (angina).  You have shortness of breath.  You have an irregular heartbeat.  You have unexplained fatigue.  You have unexplained pain or numbness in an arm, leg, or hip.  You have nausea, swelling of your hands or feet, and itchy skin. Get help right away if:  You have  any symptoms of a heart attack, such as: ? Chest pain. ? Shortness of breath. ? Pain in your neck, jaw, arms, back, or stomach. ? Cold sweat. ? Nausea. ? Light-headedness.  You have any symptoms of a stroke. "BE FAST" is an easy way to remember the main warning signs of a stroke: ? B - Balance. Signs are dizziness, sudden trouble walking, or loss of balance. ? E - Eyes. Signs are trouble seeing or a sudden change in vision. ? F - Face. Signs are sudden weakness or numbness of the face, or the face or eyelid drooping on one side. ? A - Arms. Signs are weakness or numbness in an arm. This happens suddenly and usually on one side of the body. ? S - Speech. Signs are sudden trouble speaking, slurred speech, or trouble understanding what people say. ? T - Time. Time to call emergency services. Write down what time symptoms started.  You have other signs of a stroke, such as: ? A sudden, severe headache with no known cause. ? Nausea or vomiting. ? Seizure. These symptoms may represent a serious problem that is an emergency. Do not wait to see if the symptoms will go away. Get medical help right away. Call your local emergency services (911 in the [place] Do not drive  yourself to the hospital. Summary  Atherosclerosis is narrowing and hardening of the arteries.  Arteries can become narrow or clogged with a buildup of fat, cholesterol, calcium, and other substances (plaque).  This condition may not cause any symptoms. If you do have symptoms, they are caused by damage to an area of your body that is not getting enough blood.  Treatment may include lifestyle changes and medicines. In some cases, surgery is needed. This information is not intended to replace advice given to you by your health care provider. Make sure you discuss any questions you have with your health care provider. Document Released: 03/27/2003 Document Revised: 04/15/2017 Document Reviewed: 09/09/2016 Elsevier Patient  Education  2020 [employer].   Steps to Quit Smoking Smoking tobacco is the leading cause of preventable death. It can affect almost every organ in the body. Smoking puts you and people around you at risk for many serious, long-lasting (chronic) diseases. Quitting smoking can be hard, but it is one of the best things that you can do for your health. It is never too late to quit. How do I get ready to quit? When you decide to quit smoking, make a plan to help you succeed. Before you quit:  Pick a date to quit. Set a date within the next 2 weeks to give you time to prepare.  Write down the reasons why you are quitting. Keep this list in places where you will see it often.  Tell your family, friends, and co-workers that you are quitting. Their support is important.  Talk with your doctor about the choices that may help you quit.  Find out if your health insurance will pay for these treatments.  Know the people, places, things, and activities that make you want to smoke (triggers). Avoid them. What first steps can I take to quit smoking?  Throw away all cigarettes at home, at work, and in your car.  Throw away the things that you use when you smoke, such as ashtrays and lighters.  Clean your car. Make sure to empty the ashtray.  Clean your home, including curtains and carpets. What can I do to help me quit smoking? Talk with your doctor about taking medicines and seeing a counselor at the same time. You are more likely to succeed when you do both.  If you are pregnant or breastfeeding, talk with your doctor about counseling or other ways to quit smoking. Do not take medicine to help you quit smoking unless your doctor tells you to do so. To quit smoking: Quit right away  Quit smoking totally, instead of slowly cutting back on how much you smoke over a period of time.  Go to counseling. You are more likely to quit if you go to counseling sessions regularly. Take medicine You may  take medicines to help you quit. Some medicines need a prescription, and some you can buy over-the-counter. Some medicines may contain a drug called nicotine to replace the nicotine in cigarettes. Medicines may:  Help you to stop having the desire to smoke (cravings).  Help to stop the problems that come when you stop smoking (withdrawal symptoms). Your doctor may ask you to use:  Nicotine patches, gum, or lozenges.  Nicotine inhalers or sprays.  Non-nicotine medicine that is taken by mouth. Find resources Find resources and other ways to help you quit smoking and remain smoke-free after you quit. These resources are most helpful when you use them often. They include:  Online chats with a  counselor.  Phone quitlines.  Printed Materials engineer.  Support groups or group counseling.  Text messaging programs.  Mobile phone apps. Use apps on your mobile phone or tablet that can help you stick to your quit plan. There are many free apps for mobile phones and tablets as well as websites. Examples include Quit Guide from the [employer] and smokefree.gov  What things can I do to make it easier to quit?   Talk to your family and friends. Ask them to support and encourage you.  Call a phone quitline (1-800-QUIT-NOW), reach out to support groups, or work with a Veterinary surgeon.  Ask people who smoke to not smoke around you.  Avoid places that make you want to smoke, such as: ? Bars. ? Parties. ? Smoke-break areas at work.  Spend time with people who do not smoke.  Lower the stress in your life. Stress can make you want to smoke. Try these things to help your stress: ? Getting regular exercise. ? Doing deep-breathing exercises. ? Doing yoga. ? Meditating. ? Doing a body scan. To do this, close your eyes, focus on one area of your body at a time from head to toe. Notice which parts of your body are tense. Try to relax the muscles in those areas. How will I feel when I quit smoking? Day 1 to  3 weeks Within the first 24 hours, you may start to have some problems that come from quitting tobacco. These problems are very bad 2-3 days after you quit, but they do not often last for more than 2-3 weeks. You may get these symptoms:  Mood swings.  Feeling restless, nervous, angry, or annoyed.  Trouble concentrating.  Dizziness.  Strong desire for high-sugar foods and nicotine.  Weight gain.  Trouble pooping (constipation).  Feeling like you may vomit (nausea).  Coughing or a sore throat.  Changes in how the medicines that you take for other issues work in your body.  Depression.  Trouble sleeping (insomnia). Week 3 and afterward After the first 2-3 weeks of quitting, you may start to notice more positive results, such as:  Better sense of smell and taste.  Less coughing and sore throat.  Slower heart rate.  Lower blood pressure.  Clearer skin.  Better breathing.  Fewer sick days. Quitting smoking can be hard. Do not give up if you fail the first time. Some people need to try a few times before they succeed. Do your best to stick to your quit plan, and talk with your doctor if you have any questions or concerns. Summary  Smoking tobacco is the leading cause of preventable death. Quitting smoking can be hard, but it is one of the best things that you can do for your health.  When you decide to quit smoking, make a plan to help you succeed.  Quit smoking right away, not slowly over a period of time.  When you start quitting, seek help from your doctor, family, or friends. This information is not intended to replace advice given to you by your health care provider. Make sure you discuss any questions you have with your health care provider. Document Released: 10/31/2008 Document Revised: 03/24/2018 Document Reviewed: 03/25/2018 Elsevier Patient Education  2020 [employer].

## 2018-11-22 NOTE — Progress Notes (Signed)
Name: Martin Cook   MRN: [id number]    DOB: 1960/03/22   Date:11/22/2018       Progress Note  Chief Complaint  Patient presents with   Follow-up   Hypertension   Gastroesophageal Reflux   Hyperlipidemia     Subjective:   Martin Cook is a 58 y.o. male, presents to clinic for routine follow up on the conditions listed above.  Hyperlipidemia: Current Medication Regimen:  crestor 40 mg Last Lipids: Lab Results  Component Value Date   CHOL 185 06/22/2018   HDL 72 06/22/2018   LDLCALC 90 06/22/2018   TRIG 136 06/22/2018   CHOLHDL 2.6 06/22/2018   - Current Diet:  Generally healthy - Denies: Chest pain, shortness of breath, myalgias. - Documented aortic atherosclerosis? Yes - Risk factors for atherosclerosis: hypercholesterolemia, hypertension, smoking and strong family history  Aortic atherosclerosis and family hx, he has seen cardiology in the past, no past events, eval was a few years and he was told he doesn't need any routine f/up.  Denies any exertional CP or SOB.  No claudication.    Hypertension:  Currently managed on norvasc 2.5 mL Pt reports good med compliance and denies any SE.  No lightheadedness, hypotension, syncope. Blood pressure today is well controlled. BP Readings from Last 3 Encounters:  11/22/18 132/84  06/21/17 138/82  03/29/17 130/80   Pt denies CP, SOB, exertional sx, LE edema, palpitation, Ha's, visual disturbances Dietary efforts for BP?  Generally healthy, but nothing strict   GERD: Paitent complains of heartburn. This has been associated with a little reflux or indigestion.  He denies abdominal bloating, chest pain, cough, difficulty swallowing and dysphagia. Symptoms have been present for several years. He denies dysphagia.  He has not lost weight. He denies melena, hematochezia, hematemesis, and coffee ground emesis. Medical therapy in the past has included H2 antagonists.  Well controlled with decreasing dose of pepcid from 40 mg to  20 mg and sometimes PRN doses.  Current Smoker more than 30 pack year hx - had done a low dose CT screening done.  Is not currently motivated to cut back or quit smoking, but he knows he should.  "its just a habit" he says.  He does have a chronic cough and gets bronchitis every other year to once or twice a year.  During the visit today he did have very wet sounding cough multiple times.  He does deny any chest pain, exertional dyspnea, wheeze, night sweats, fatigue or unintentional weight loss.  Prediabetes No change to weight or diet since last visit with labs, not on meds, denies any polyuria, polydipsia, nocturia, weight loss, neuropathy    Patient Active Problem List   Diagnosis Date Noted   Aortic atherosclerosis (HCC) 05/22/2018   Vitamin D deficiency 05/22/2018   Prediabetes 06/21/2017   ED (erectile dysfunction) of organic origin 03/29/2017   Wheezing on auscultation 04/07/2016   Cough 04/07/2016   Tobacco abuse 12/10/2014   Hypertension 09/09/2014   GERD (gastroesophageal reflux disease) 09/09/2014   Hyperlipidemia 09/09/2014    History reviewed. No pertinent surgical history.  Family History  Problem Relation Age of Onset   Stroke Mother    Stroke Father    Deep vein thrombosis Father    Cancer Brother        liver   Stroke Maternal Grandfather        Pt is unsure     Social History   Socioeconomic History   Marital status: Married  Spouse name: Not on file   Number of children: Not on file   Years of education: Not on file   Highest education level: Not on file  Occupational History   Not on file  Social Needs   Financial resource strain: Not on file   Food insecurity    Worry: Not on file    Inability: Not on file   Transportation needs    Medical: Not on file    Non-medical: Not on file  Tobacco Use   Smoking status: Current Every Day Smoker    Packs/day: 1.00    Years: 43.00    Pack years: 43.00    Types: Cigarettes     Smokeless tobacco: Never Used  Substance and Sexual Activity   Alcohol use: No    Alcohol/week: 0.0 standard drinks    Comment: occasional   Drug use: No   Sexual activity: Yes  Lifestyle   Physical activity    Days per week: Not on file    Minutes per session: Not on file   Stress: Not on file  Relationships   Social connections    Talks on phone: Not on file    Gets together: Not on file    Attends religious service: Not on file    Active member of club or organization: Not on file    Attends meetings of clubs or organizations: Not on file    Relationship status: Not on file   Intimate partner violence    Fear of current or ex partner: Not on file    Emotionally abused: Not on file    Physically abused: Not on file    Forced sexual activity: Not on file  Other Topics Concern   Not on file  Social History Narrative   Not on file     Current Outpatient Medications:    amLODipine (NORVASC) 2.5 MG tablet, Take 1 tablet (2.5 mg total) by mouth daily., Disp: 90 tablet, Rfl: 3   aspirin EC 81 MG tablet, Take 81 mg by mouth daily., Disp: , Rfl:    famotidine (PEPCID) 40 MG tablet, TAKE 1 TABLET BY MOUTH EVERYDAY AT BEDTIME, Disp: 90 tablet, Rfl: 1   rosuvastatin (CRESTOR) 40 MG tablet, Take 1 tablet (40 mg total) by mouth daily., Disp: 90 tablet, Rfl: 3   famotidine (PEPCID) 20 MG tablet, TAKE 2 TABLETS BY MOUTH EVERYDAY AT BEDTIME, Disp: , Rfl:   No Known Allergies  I personally reviewed active problem list, medication list, allergies, family history, social history, health maintenance, notes from last encounter, lab results with the patient/caregiver today.  Review of Systems  Constitutional: Negative.   HENT: Negative.   Eyes: Negative.   Respiratory: Negative.   Cardiovascular: Negative.   Gastrointestinal: Negative.   Endocrine: Negative.   Genitourinary: Negative.   Musculoskeletal: Negative.   Skin: Negative.   Allergic/Immunologic: Negative.    Neurological: Negative.   Hematological: Negative.   Psychiatric/Behavioral: Negative.   All other systems reviewed and are negative.    Objective:    Vitals:   11/22/18 1032  BP: 132/84  Pulse: 81  Resp: 14  Temp: 98.1 F (36.7 C)  SpO2: 99%  Weight: 184 lb 14.4 oz (83.9 kg)  Height:  (1.702 m)    Body mass index is 28.96 kg/m.  Physical Exam Vitals signs and nursing note reviewed.  Constitutional:      General: He is not in acute distress.    Appearance: Normal appearance. He is well-developed.  He is not ill-appearing, toxic-appearing or diaphoretic.     Interventions: Face mask in place.  HENT:     Head: Normocephalic and atraumatic.     Jaw: No trismus.     Right Ear: External ear normal.     Left Ear: External ear normal.     Nose: Nose normal.     Mouth/Throat:     Mouth: Mucous membranes are moist.  Eyes:     General: Lids are normal. No scleral icterus.    Conjunctiva/sclera: Conjunctivae normal.     Pupils: Pupils are equal, round, and reactive to light.  Neck:     Musculoskeletal: Normal range of motion and neck supple.     Trachea: Trachea and phonation normal. No tracheal deviation.  Cardiovascular:     Rate and Rhythm: Normal rate and regular rhythm.     Pulses: Normal pulses.          Radial pulses are 2+ on the right side and 2+ on the left side.       Posterior tibial pulses are 2+ on the right side and 2+ on the left side.     Heart sounds: Normal heart sounds. No murmur. No friction rub. No gallop.   Pulmonary:     Effort: Pulmonary effort is normal. No respiratory distress.     Breath sounds: Normal breath sounds. No stridor. No wheezing, rhonchi or rales.  Abdominal:     General: Bowel sounds are normal. There is no distension.     Palpations: Abdomen is soft.     Tenderness: There is no abdominal tenderness. There is no guarding or rebound.  Musculoskeletal: Normal range of motion.     Right lower leg: No edema.     Left lower  leg: No edema.  Skin:    General: Skin is warm and dry.     Capillary Refill: Capillary refill takes less than 2 seconds.     Coloration: Skin is not jaundiced or pale.     Findings: No rash.     Nails: There is no clubbing.   Neurological:     Mental Status: He is alert.     Cranial Nerves: No dysarthria or facial asymmetry.     Motor: No tremor or abnormal muscle tone.     Gait: Gait normal.  Psychiatric:        Mood and Affect: Mood normal.        Speech: Speech normal.        Behavior: Behavior normal. Behavior is cooperative.      No results found for this or any previous visit (from the past 2160 hour(s)).    PHQ2/9: Depression screen [facility] 2/9 11/22/2018 05/22/2018 06/21/2017 02/16/2017 12/15/2016  Decreased Interest 0 0 0 0 0  Down, Depressed, Hopeless 0 0 0 0 0  PHQ - 2 Score 0 0 0 0 0  Altered sleeping 0 0 - - -  Tired, decreased energy 0 0 - - -  Change in appetite 0 0 - - -  Feeling bad or failure about yourself  0 0 - - -  Trouble concentrating 0 0 - - -  Moving slowly or fidgety/restless 0 0 - - -  Suicidal thoughts 0 0 - - -  PHQ-9 Score 0 0 - - -  Difficult doing work/chores Not difficult at all Not difficult at all - - -    phq 9 is negative Reviewed today  Fall Risk: Fall Risk  11/22/2018 06/21/2017 02/16/2017  12/15/2016 09/03/2016  Falls in the past year? 1 No No No No  Number falls in past yr: 1 - - - -  Injury with Fall? 0 - - - -      Functional Status Survey: Is the patient deaf or have difficulty hearing?: No Does the patient have difficulty seeing, even when wearing glasses/contacts?: No Does the patient have difficulty concentrating, remembering, or making decisions?: No Does the patient have difficulty walking or climbing stairs?: No Does the patient have difficulty dressing or bathing?: No Does the patient have difficulty doing errands alone such as visiting a doctor's office or shopping?: No    Assessment & Plan:       ICD-10-CM   1.  Essential hypertension  I10 COMPLETE METABOLIC PANEL WITH GFR   well controlled, 6 month f/up   2. Mixed hyperlipidemia  E78.2 COMPLETE METABOLIC PANEL WITH GFR    Lipid panel   well controlled with crestor 40 mg, compliant and tolerating   3. Aortic atherosclerosis (HCC)  I70.0 CBC with Differential/Platelet    COMPLETE METABOLIC PANEL WITH GFR    Lipid panel   on statin, BP well controlled, discussed smoking cessation, no sx   4. Prediabetes  R73.03 Hemoglobin A1c   recheck labs and then would follow every 6-12 months   5. Gastroesophageal reflux disease, unspecified whether esophagitis present  K21.9    well controlled with PRN to daily 20 mg pepcid   6. Tobacco abuse  Smoking cessation instruction/counseling given:  counseled patient on the dangers of tobacco use, advised patient to stop smoking, and reviewed strategies to maximize success Offered resources, pt declined  Z72.0   7. Smokers' cough (HCC)  J41.0    lungs clear but very wet sounding cough several times in clinic, concern for COPD with smoking hx, not currently on any tx, encouraged him to please f/up in clinic if any respiratory sx start or worsen would want PFTs or pulm eval   8. Encounter for medication monitoring  Z51.81 CBC with Differential/Platelet    COMPLETE METABOLIC PANEL WITH GFR    Lipid panel    Hemoglobin A1c   Return in about 6 months (around 05/22/2019) for Routine follow-up, Annual Physical.   Danelle Berry, PA-C 11/22/18 10:50 AM

## 2018-11-23 LAB — CBC WITH DIFFERENTIAL/PLATELET
Absolute Monocytes: 419 cells/uL (ref 200–950)
Basophils Absolute: 59 cells/uL (ref 0–200)
Basophils Relative: 1 %
Eosinophils Absolute: 59 cells/uL (ref 15–500)
Eosinophils Relative: 1 %
HCT: 45.9 % (ref 38.5–50.0)
Hemoglobin: 15.6 g/dL (ref 13.2–17.1)
Lymphs Abs: 1611 cells/uL (ref 850–3900)
MCH: 30.8 pg (ref 27.0–33.0)
MCHC: 34 g/dL (ref 32.0–36.0)
MCV: 90.5 fL (ref 80.0–100.0)
MPV: 9.9 fL (ref 7.5–12.5)
Monocytes Relative: 7.1 %
Neutro Abs: 3752 cells/uL (ref 1500–7800)
Neutrophils Relative %: 63.6 %
Platelets: 234 10*3/uL (ref 140–400)
RBC: 5.07 10*6/uL (ref 4.20–5.80)
RDW: 12.8 % (ref 11.0–15.0)
Total Lymphocyte: 27.3 %
WBC: 5.9 10*3/uL (ref 3.8–10.8)

## 2018-11-23 LAB — COMPLETE METABOLIC PANEL WITH GFR
AG Ratio: 2 (calc) (ref 1.0–2.5)
ALT: 24 U/L (ref 9–46)
AST: 23 U/L (ref 10–35)
Albumin: 4.5 g/dL (ref 3.6–5.1)
Alkaline phosphatase (APISO): 68 U/L (ref 35–144)
BUN: 11 mg/dL (ref 7–25)
CO2: 30 mmol/L (ref 20–32)
Calcium: 9.5 mg/dL (ref 8.6–10.3)
Chloride: 102 mmol/L (ref 98–110)
Creat: 0.81 mg/dL (ref 0.70–1.33)
GFR, Est African American: 113 mL/min/{1.73_m2} (ref >=60)
GFR, Est Non African American: 97 mL/min/{1.73_m2} (ref >=60)
Globulin: 2.3 g/dL (calc) (ref 1.9–3.7)
Glucose, Bld: 105 mg/dL — ABNORMAL HIGH (ref 65–99)
Potassium: 4.3 mmol/L (ref 3.5–5.3)
Sodium: 141 mmol/L (ref 135–146)
Total Bilirubin: 1.1 mg/dL (ref 0.2–1.2)
Total Protein: 6.8 g/dL (ref 6.1–8.1)

## 2018-11-23 LAB — LIPID PANEL
Cholesterol: 179 mg/dL (ref <200)
HDL: 62 mg/dL (ref >=40)
LDL Cholesterol (Calc): 91 mg/dL (calc)
Non-HDL Cholesterol (Calc): 117 mg/dL (calc) (ref <130)
Total CHOL/HDL Ratio: 2.9 (calc) (ref <5.0)
Triglycerides: 165 mg/dL — ABNORMAL HIGH (ref <150)

## 2018-11-23 LAB — HEMOGLOBIN A1C
Hgb A1c MFr Bld: 5.9 % of total Hgb — ABNORMAL HIGH (ref <5.7)
Mean Plasma Glucose: 123 (calc)
eAG (mmol/L): 6.8 (calc)

## 2018-12-08 ENCOUNTER — Telehealth: Payer: Self-pay | Admitting: *Deleted

## 2018-12-08 NOTE — Telephone Encounter (Signed)
Contacted in attempt to schedule lung screening scan. Reports he is still too busy to schedule. Requests to consider lung screening in January.

## 2019-02-15 ENCOUNTER — Telehealth: Payer: Self-pay | Admitting: *Deleted

## 2019-02-15 NOTE — Telephone Encounter (Signed)
Contacted in attempt to schedule lung screening scan. Patient requests to consider screening in a month.

## 2019-02-19 ENCOUNTER — Other Ambulatory Visit: Payer: Self-pay | Admitting: Family Medicine

## 2019-02-19 DIAGNOSIS — K219 Gastro-esophageal reflux disease without esophagitis: Secondary | ICD-10-CM

## 2019-02-19 NOTE — Telephone Encounter (Signed)
Requested medication (s) are due for refill today: yes  Requested medication (s) are on the active medication list: yes  Last refill:  11/25/2018  Future visit scheduled: no  Notes to clinic:  historical provider    Requested Prescriptions  Pending Prescriptions Disp Refills   famotidine (PEPCID) 20 MG tablet [Pharmacy Med Name: FAMOTIDINE 20 MG TABLET] 180 tablet 1    Sig: TAKE 2 TABLETS BY MOUTH EVERYDAY AT BEDTIME      Gastroenterology:  H2 Antagonists Passed - 02/19/2019  1:34 AM      Passed - Valid encounter within last 12 months    Recent Outpatient Visits           2 months ago Essential hypertension   [facility] Danelle Berry, PA-C   9 months ago Pure hypercholesterolemia   [facility] [place], Gerome Apley, FNP   1 year ago Essential hypertension   [facility], Janit Bern, MD   1 year ago Community acquired pneumonia of right lower lobe of lung [facility])   [facility] Doren Custard, FNP   2 years ago Annual physical exam   [facility] Ellyn Hack, MD

## 2019-03-13 ENCOUNTER — Telehealth: Payer: Self-pay | Admitting: *Deleted

## 2019-03-13 NOTE — Telephone Encounter (Signed)
(  03/13/19) Left message for patient to notify them that it is time to schedule annual low dose lung cancer screening CT scan. Instructed patient to call back to verify information prior to the scan being scheduled SRW

## 2019-03-30 ENCOUNTER — Telehealth: Payer: Self-pay | Admitting: *Deleted

## 2019-03-30 NOTE — Telephone Encounter (Signed)
(  03/30/19) Left message for pt to notify them that it is time to schedule annual low dose lung cancer screening CT scan. Instructed patient to call back to verify information prior to the scan being scheduled °SRW °  ° ° °

## 2019-04-23 ENCOUNTER — Other Ambulatory Visit: Payer: Self-pay | Admitting: Family Medicine

## 2019-04-23 DIAGNOSIS — I10 Essential (primary) hypertension: Secondary | ICD-10-CM

## 2019-04-26 ENCOUNTER — Telehealth: Payer: Self-pay

## 2019-04-26 NOTE — Telephone Encounter (Signed)
Unsuccessful attempt at calling patient to notify them that it is time to schedule the low dose lung cancer screening CT scan. 

## 2019-05-16 ENCOUNTER — Other Ambulatory Visit: Payer: Self-pay | Admitting: Family Medicine

## 2019-05-16 DIAGNOSIS — I10 Essential (primary) hypertension: Secondary | ICD-10-CM

## 2019-05-16 DIAGNOSIS — E7800 Pure hypercholesterolemia, unspecified: Secondary | ICD-10-CM

## 2019-05-16 NOTE — Telephone Encounter (Signed)
Requested Prescriptions  Pending Prescriptions Disp Refills  . rosuvastatin (CRESTOR) 40 MG tablet [Pharmacy Med Name: ROSUVASTATIN CALCIUM 40 MG TAB] 90 tablet 3    Sig: TAKE 1 TABLET BY MOUTH EVERY DAY     Cardiovascular:  Antilipid - Statins Failed - 05/16/2019  3:11 AM      Failed - Triglycerides in normal range and within 360 days    Triglycerides  Date Value Ref Range Status  11/22/2018 165 (H) <150 mg/dL Final         Passed - Total Cholesterol in normal range and within 360 days    Cholesterol, Total  Date Value Ref Range Status  06/09/2015 185 100 - 199 mg/dL Final   Cholesterol  Date Value Ref Range Status  11/22/2018 179 <200 mg/dL Final         Passed - LDL in normal range and within 360 days    LDL Cholesterol (Calc)  Date Value Ref Range Status  11/22/2018 91 mg/dL (calc) Final    Comment:    Reference range: <100 . Desirable range <100 mg/dL for primary prevention;   <70 mg/dL for patients with CHD or diabetic patients  with > or = 2 CHD risk factors. Marland Kitchen LDL-C is now calculated using the Martin-Hopkins  calculation, which is a validated novel method providing  better accuracy than the Friedewald equation in the  estimation of LDL-C.  Horald Pollen et al. Lenox Ahr. 4010;272(53): 2061-2068  (http://education.[employer].com/faq/FAQ164)          Passed - HDL in normal range and within 360 days    HDL  Date Value Ref Range Status  11/22/2018 62 > OR = 40 mg/dL Final  66/44/0347 57 >42 mg/dL Final         Passed - Patient is not pregnant      Passed - Valid encounter within last 12 months    Recent Outpatient Visits          5 months ago Essential hypertension   [facility] Danelle Berry, PA-C   11 months ago Pure hypercholesterolemia   [facility] Doren Custard, FNP   1 year ago Essential hypertension   [facility], Janit Bern, MD   2 years ago Community acquired pneumonia of right lower  lobe of lung [facility])   [facility] Doren Custard, FNP   2 years ago Annual physical exam   [facility] Ellyn Hack, MD

## 2019-06-25 ENCOUNTER — Telehealth: Payer: Self-pay

## 2019-06-25 NOTE — Telephone Encounter (Signed)
Patient needs appt for refills

## 2019-06-25 NOTE — Telephone Encounter (Signed)
lvm for pt to schedule an appt 

## 2019-06-28 NOTE — Progress Notes (Signed)
Patient ID: Martin Cook, male    DOB: 1960-08-01, 59 y.o.   MRN: [id number]  PCP: Kerman Passey, MD  Chief Complaint  Patient presents with  . Follow-up  . Hypertension  . Hyperlipidemia  . Gastroesophageal Reflux    Subjective:   Martin Cook is a 59 y.o. male, presents to clinic with CC of the following:  Chief Complaint  Patient presents with  . Follow-up  . Hypertension  . Hyperlipidemia  . Gastroesophageal Reflux    HPI:  Patient is a 59 year old male Last visit at [facility] was with Danelle Berry on 11/22/2018 He follows up today. He has not had the lung screening CT scan done in the recent past. Had done one time in past.  He notes he is busy at work, and limiting his opportunity to pursue this screening test  Hyperlipidemia: Current Medication Regimen:  crestor 40 mg Last Lipids: Lab Results  Component Value Date   CHOL 179 11/22/2018   HDL 62 11/22/2018   LDLCALC 91 11/22/2018   TRIG 165 (H) 11/22/2018   CHOLHDL 2.9 11/22/2018   - Denies:  myalgias. .    Hypertension:  Currently managed on norvasc 2.5 mg Pt reports good med compliance  Not check BPs at home  BP Readings from Last 3 Encounters:  06/29/19 138/86  11/22/18 132/84  06/21/17 138/82    Pt denies CP, SOB, increased LE edema, palpitation, increased Ha's, visual disturbances  GERD:  Med regimen - Pepcid - 20mg  daily Well controlled with this Trying to watch diet triggers  He denies dysphagia, dark or black stools, increased abdominal pains, vomiting  Current Smoker more than 30 pack year hx - about a PPD presently had a low dose CT screening done in the past Is not currently motivated to cut back or quit smoking, but he knows he should. He was honest noting not ready to quit again today.  Vitamin D deficiency Last vitamin D check was slightly better, was 17 prior. Not taking a supplement presently. Lab Results  Component Value Date   VD25OH 20 (L) 06/22/2018     Prediabetes/Overweight Lab Results  Component Value Date   HGBA1C 5.9 (H) 11/22/2018   Med regimen - none Decreasing beer consumption should help. denies any polyuria as drinks a lot of teas and fluids, up only once a night to urinate, sometimes not at all, no polydipsia or numbness and tingling in the extremities  Wt Readings from Last 3 Encounters:  06/29/19 189 lb 9.6 oz (86 kg)  11/22/18 184 lb 14.4 oz (83.9 kg)  07/05/17 172 lb (78 kg)   Weight has increased some in the recent past, noted "I'm going to work on that", wants to lose weight Only exercise is mow the yard, will try to walk more   Alcohol -he notes he has significantly reduced his beer consumption, does not drink any during the week, only a few on the weekend.  Had been as high as a 6 pack nightly previously.  He notes feeling much better since reducing this consumption.  Patient Active Problem List   Diagnosis Date Noted  . Aortic atherosclerosis (HCC) 05/22/2018  . Vitamin D deficiency 05/22/2018  . Prediabetes 06/21/2017  . ED (erectile dysfunction) of organic origin 03/29/2017  . Wheezing on auscultation 04/07/2016  . Cough 04/07/2016  . Tobacco abuse 12/10/2014  . Hypertension 09/09/2014  . GERD (gastroesophageal reflux disease) 09/09/2014  . Hyperlipidemia 09/09/2014      Current Outpatient  Medications:  .  amLODipine (NORVASC) 2.5 MG tablet, TAKE 1 TABLET BY MOUTH EVERY DAY, Disp: 30 tablet, Rfl: 1 .  aspirin EC 81 MG tablet, Take 81 mg by mouth daily., Disp: , Rfl:  .  famotidine (PEPCID) 20 MG tablet, Take 1-2 tablets (20-40 mg total) by mouth daily as needed for heartburn or indigestion., Disp: 180 tablet, Rfl: 1 .  rosuvastatin (CRESTOR) 40 MG tablet, TAKE 1 TABLET BY MOUTH EVERY DAY, Disp: 90 tablet, Rfl: 2   No Known Allergies   History reviewed. No pertinent surgical history.   Family History  Problem Relation Age of Onset  . Stroke Mother   . Hyperlipidemia Mother   . Stroke  Father   . Deep vein thrombosis Father   . Hyperlipidemia Father   . Hyperlipidemia Sister   . Hyperlipidemia Brother   . Cancer Brother 46       bone  . Cancer Brother 43       liver  . Hyperlipidemia Brother   . Stroke Maternal Grandfather        Pt is unsure      Social History   Tobacco Use  . Smoking status: Current Every Day Smoker    Packs/day: 1.00    Years: 43.00    Pack years: 43.00    Types: Cigarettes  . Smokeless tobacco: Never Used  Substance Use Topics  . Alcohol use: No    Alcohol/week: 0.0 standard drinks    Comment: occasional    With staff assistance, above reviewed with the patient today.  ROS: As per HPI, otherwise no specific complaints on a limited and focused system review   No results found for this or any previous visit (from the past 72 hour(s)).   PHQ2/9: Depression screen [facility] 2/9 06/29/2019 11/22/2018 05/22/2018 06/21/2017 02/16/2017  Decreased Interest 0 0 0 0 0  Down, Depressed, Hopeless 0 0 0 0 0  PHQ - 2 Score 0 0 0 0 0  Altered sleeping 0 0 0 - -  Tired, decreased energy 0 0 0 - -  Change in appetite 0 0 0 - -  Feeling bad or failure about yourself  0 0 0 - -  Trouble concentrating 0 0 0 - -  Moving slowly or fidgety/restless 0 0 0 - -  Suicidal thoughts 0 0 0 - -  PHQ-9 Score 0 0 0 - -  Difficult doing work/chores Not difficult at all Not difficult at all Not difficult at all - -   PHQ-2/9 Result is   Fall Risk: Fall Risk  06/29/2019 11/22/2018 06/21/2017 02/16/2017 12/15/2016  Falls in the past year? 0 1 No No No  Number falls in past yr: 0 1 - - -  Injury with Fall? 0 0 - - -      Objective:   Vitals:   06/29/19 1048  BP: 138/86  Pulse: 86  Resp: 16  Temp: 98.5 F (36.9 C)  TempSrc: Temporal  SpO2: 99%  Weight: 189 lb 9.6 oz (86 kg)  Height: 5\' 7"  (1.702 m)    Body mass index is 29.7 kg/m.  Physical Exam   NAD, masked, pleasant HEENT - /AT, sclera anicteric, PERRL, EOMI, conj - non-inj'ed, poor dentition -  lower, pharynx clear Neck - supple, no adenopathy, no TM, carotids 2+ and = without bruits bilat Car - RRR without m/g/r Pulm- RR and effort normal at rest, CTA without wheeze or rales Abd - soft, NT, mildly obese, ND, BS+,  no masses Back - no CVA tenderness Ext - no LE edema, no active joints Neuro/psychiatric - affect was not flat, appropriate with conversation  Alert and oriented  Grossly non-focal - good strength on testing extremities, sensation intact to LT in distal extremities  Speech normal   Results for orders placed or performed in visit on 11/22/18  CBC with Differential/Platelet  Result Value Ref Range   WBC 5.9 3.8 - 10.8 Thousand/uL   RBC 5.07 4.20 - 5.80 Million/uL   Hemoglobin 15.6 13.2 - 17.1 g/dL   HCT 45.9 38 - 50 %   MCV 90.5 80.0 - 100.0 fL   MCH 30.8 27.0 - 33.0 pg   MCHC 34.0 32.0 - 36.0 g/dL   RDW 12.8 11.0 - 15.0 %   Platelets 234 140 - 400 Thousand/uL   MPV 9.9 7.5 - 12.5 fL   Neutro Abs 3,752 1,500 - 7,800 cells/uL   Lymphs Abs 1,611 850 - 3,900 cells/uL   Absolute Monocytes 419 200 - 950 cells/uL   Eosinophils Absolute 59 15 - 500 cells/uL   Basophils Absolute 59 0 - 200 cells/uL   Neutrophils Relative % 63.6 %   Total Lymphocyte 27.3 %   Monocytes Relative 7.1 %   Eosinophils Relative 1.0 %   Basophils Relative 1.0 %  COMPLETE METABOLIC PANEL WITH GFR  Result Value Ref Range   Glucose, Bld 105 (H) 65 - 99 mg/dL   BUN 11 7 - 25 mg/dL   Creat 0.81 0.70 - 1.33 mg/dL   GFR, Est Non African American 97 > OR = 60 mL/min/1.18m2   GFR, Est African American 113 > OR = 60 mL/min/1.70m2   BUN/Creatinine Ratio NOT APPLICABLE 6 - 22 (calc)   Sodium 141 135 - 146 mmol/L   Potassium 4.3 3.5 - 5.3 mmol/L   Chloride 102 98 - 110 mmol/L   CO2 30 20 - 32 mmol/L   Calcium 9.5 8.6 - 10.3 mg/dL   Total Protein 6.8 6.1 - 8.1 g/dL   Albumin 4.5 3.6 - 5.1 g/dL   Globulin 2.3 1.9 - 3.7 g/dL (calc)   AG Ratio 2.0 1.0 - 2.5 (calc)   Total Bilirubin 1.1 0.2 - 1.2  mg/dL   Alkaline phosphatase (APISO) 68 35 - 144 U/L   AST 23 10 - 35 U/L   ALT 24 9 - 46 U/L  Lipid panel  Result Value Ref Range   Cholesterol 179 <200 mg/dL   HDL 62 > OR = 40 mg/dL   Triglycerides 165 (H) <150 mg/dL   LDL Cholesterol (Calc) 91 mg/dL (calc)   Total CHOL/HDL Ratio 2.9 <5.0 (calc)   Non-HDL Cholesterol (Calc) 117 <130 mg/dL (calc)  Hemoglobin A1c  Result Value Ref Range   Hgb A1c MFr Bld 5.9 (H) <5.7 % of total Hgb   Mean Plasma Glucose 123 (calc)   eAG (mmol/L) 6.8 (calc)  Last labs reviewed A1C ordered and told cannot do in the office as out of supplies to do.     Assessment & Plan:  1. Essential hypertension Blood pressure reasonable on his current regimen, low-dose amlodipine. Noted borderline-ish reading, is getting close to the systolic at 354 and the diastolic at 90. Continue to monitor Do feel the alcohol reduction, and encouraged his goal of trying to lose weight will be helpful presently.  2. Mixed hyperlipidemia Last lipid panel reviewed. Is on a statin presently, and to continue. We will check again on a follow-up visit. Diet modifications and increasing  physical activity levels important in helping in addition to the statin noted today and encouraged  3. Prediabetes Noted last A1c's, consistent with prediabetes. When I did check one in the office today, although out of supplies. Felt reasonable to hold off on a blood test for this, and will check again on his follow-up visit.  4. Gastroesophageal reflux disease, unspecified whether esophagitis present Doing well on the Pepcid product daily, and emphasized the importance of reflux precautions in addition to help which she is trying to do presently. Continue with the Pepcid presently  5. Tobacco abuse He knows he could try to quit when discussed, and honestly told me today he is not ready to quit.  We will continue to encourage tobacco cessation  6. Alcohol use Congratulated on lessening his  alcohol use, and felt that it is extremely important with many conditions we are following We will continue with the lessened alcohol use, and he notes he feels better after reducing the alcohol consumption  7. Overweight (BMI 25.0-29.9) Discussed the importance of healthy weight maintenance, and trying to lose a few pounds.  He notes he will try to get more active to help, and strongly encouraged.  8. Vitamin D deficiency Vitamin D level mildly decreased on last check, and can add when we recheck labs on his follow-up.  We will follow-up again in approximately 5 months time, sooner as needed, and will come fasting to recheck labs on that visit.

## 2019-06-29 ENCOUNTER — Other Ambulatory Visit: Payer: Self-pay

## 2019-06-29 ENCOUNTER — Encounter: Payer: Self-pay | Admitting: Internal Medicine

## 2019-06-29 ENCOUNTER — Ambulatory Visit: Payer: BLUE CROSS/BLUE SHIELD | Admitting: Internal Medicine

## 2019-06-29 ENCOUNTER — Other Ambulatory Visit: Payer: Self-pay | Admitting: Family Medicine

## 2019-06-29 VITALS — BP 138/86 | HR 86 | Temp 98.5°F | Resp 16 | Ht 67.0 in | Wt 189.6 lb

## 2019-06-29 DIAGNOSIS — K219 Gastro-esophageal reflux disease without esophagitis: Secondary | ICD-10-CM

## 2019-06-29 DIAGNOSIS — F1090 Alcohol use, unspecified, uncomplicated: Secondary | ICD-10-CM

## 2019-06-29 DIAGNOSIS — E782 Mixed hyperlipidemia: Secondary | ICD-10-CM | POA: Diagnosis not present

## 2019-06-29 DIAGNOSIS — I10 Essential (primary) hypertension: Secondary | ICD-10-CM

## 2019-06-29 DIAGNOSIS — R7303 Prediabetes: Secondary | ICD-10-CM

## 2019-06-29 DIAGNOSIS — E559 Vitamin D deficiency, unspecified: Secondary | ICD-10-CM

## 2019-06-29 DIAGNOSIS — E663 Overweight: Secondary | ICD-10-CM

## 2019-06-29 DIAGNOSIS — Z7289 Other problems related to lifestyle: Secondary | ICD-10-CM

## 2019-06-29 DIAGNOSIS — Z720 Tobacco use: Secondary | ICD-10-CM

## 2019-06-29 DIAGNOSIS — Z789 Other specified health status: Secondary | ICD-10-CM

## 2019-06-29 NOTE — Patient Instructions (Signed)

## 2019-07-02 ENCOUNTER — Other Ambulatory Visit: Payer: Self-pay | Admitting: Internal Medicine

## 2019-07-02 DIAGNOSIS — I10 Essential (primary) hypertension: Secondary | ICD-10-CM

## 2019-07-02 MED ORDER — AMLODIPINE BESYLATE 2.5 MG PO TABS: 2.5000 mg | ORAL_TABLET | Freq: Every day | ORAL | 1 refills | Status: DC

## 2019-07-02 NOTE — Telephone Encounter (Signed)
Refill request for Amlodipine Preferred [facility]acy is [facility] located [address]

## 2019-07-26 ENCOUNTER — Encounter: Payer: Self-pay | Admitting: *Deleted

## 2019-11-27 NOTE — Progress Notes (Signed)
Patient ID: Martin Cook, male    DOB: December 22, 1960, 59 y.o.   MRN: [id number]  PCP: Jamelle Haring, MD  Chief Complaint  Patient presents with  . Follow-up    Subjective:   Martin Cook is a 59 y.o. male, presents to clinic with CC of the following:  Chief Complaint  Patient presents with  . Follow-up    HPI:  Patient is a 59 year old male Last visit with me was in June 2021 He follows up today. All and all, notes he is feeling well.  Hyperlipidemia: Current Medication Regimen:crestor 40 mg Last Lipids: Lab Results  Component Value Date   CHOL 179 11/22/2018   HDL 62 11/22/2018   LDLCALC 91 11/22/2018   TRIG 165 (H) 11/22/2018   CHOLHDL 2.9 11/22/2018   Denies:  myalgias. .   Hypertension:  Currently managed onnorvasc 2.5 mg Pt reportsgoodmed compliance  Not check BPs at home  BP Readings from Last 3 Encounters:  11/29/19 (!) 144/90  06/29/19 138/86  11/22/18 132/84    Pt denies CP, SOB, increased LE edema, palpitation, increased Ha's, visual disturbances  GERD:  Med regimen - Pepcid - 20mg  daily Well controlled with this Trying to watch diet triggers Hedenies dysphagia, dark or black stools, increased abdominal pains, vomiting  CurrentSmoker more than 30 pack year hx - about a PPD presently He has not had the lung screening CT scan done in the recent past. Had done one time in past.  He notes he is busy at work, and limiting his opportunity to pursue this screening test Is not currently motivated to cut back or quit smoking, but he knows he should. He was honest noting not ready to quit again today.  Did state he would try to work on lessening.  Vitamin D deficiency Last vitamin D check was slightly better, was 17 prior. Not taking a supplement presently. Noted Vit D supp in past constipated him  Last vitamin D Lab Results  Component Value Date   VD25OH 20 (L) 06/22/2018    Prediabetes/Overweight Lab  Results  Component Value Date   HGBA1C 5.9 (H) 11/22/2018   HGBA1C 5.8 (H) 06/22/2018   HGBA1C 5.8 (H) 06/21/2017   Lab Results  Component Value Date   LDLCALC 91 11/22/2018   CREATININE 0.81 11/22/2018   Med regimen - none Decreasing beer consumption should help. Denies polyuria, increased thirst, no numbness or tingling in ext's  Wt Readings from Last 3 Encounters:  11/29/19 190 lb (86.2 kg)  06/29/19 189 lb 9.6 oz (86 kg)  11/22/18 184 lb 14.4 oz (83.9 kg)    Weight has increased some in the past year, stable in the past 5 to 6 months noted "I'm going to work on that", wants to lose weight,  diet-noted trying to eat healthier Exercise - still trying to walk more  Wife lost 120 pounds and "rubbing off on me"   Alcohol -he notes he has significantly reduced his beer consumption, does not drink any during the week, only a few on the weekend.  Had been as high as a 6 pack nightly previously.  He notes feeling much better since reducing this consumption  Patient Active Problem List   Diagnosis Date Noted  . Alcohol use 11/29/2019  . Overweight (BMI 25.0-29.9) 06/29/2019  . Mixed hyperlipidemia 06/29/2019  . Aortic atherosclerosis (HCC) 05/22/2018  . Vitamin D deficiency 05/22/2018  . Prediabetes 06/21/2017  . ED (erectile dysfunction) of organic origin 03/29/2017  .  Wheezing on auscultation 04/07/2016  . Cough 04/07/2016  . Tobacco abuse 12/10/2014  . Hypertension 09/09/2014  . GERD (gastroesophageal reflux disease) 09/09/2014      Current Outpatient Medications:  .  amLODipine (NORVASC) 2.5 MG tablet, Take 1 tablet (2.5 mg total) by mouth daily., Disp: 90 tablet, Rfl: 1 .  aspirin EC 81 MG tablet, Take 81 mg by mouth daily., Disp: , Rfl:  .  famotidine (PEPCID) 20 MG tablet, TAKE 1-2 TABLETS (20-40 MG TOTAL) BY MOUTH DAILY AS NEEDED FOR HEARTBURN OR INDIGESTION., Disp: 180 tablet, Rfl: 1 .  rosuvastatin (CRESTOR) 40 MG tablet, TAKE 1 TABLET BY MOUTH EVERY DAY, Disp:  90 tablet, Rfl: 2   No Known Allergies   History reviewed. No pertinent surgical history.   Family History  Problem Relation Age of Onset  . Stroke Mother   . Hyperlipidemia Mother   . Stroke Father   . Deep vein thrombosis Father   . Hyperlipidemia Father   . Hyperlipidemia Sister   . Hyperlipidemia Brother   . Cancer Brother 46       bone  . Cancer Brother 63       liver  . Hyperlipidemia Brother   . Stroke Maternal Grandfather        Pt is unsure      Social History   Tobacco Use  . Smoking status: Current Every Day Smoker    Packs/day: 1.00    Years: 43.00    Pack years: 43.00    Types: Cigarettes  . Smokeless tobacco: Never Used  Substance Use Topics  . Alcohol use: No    Alcohol/week: 0.0 standard drinks    Comment: occasional    With staff assistance, above reviewed with the patient today.  ROS: As per HPI, otherwise no specific complaints on a limited and focused system review   No results found for this or any previous visit (from the past 72 hour(s)).   PHQ2/9: Depression screen [facility] 2/9 11/29/2019 06/29/2019 11/22/2018 05/22/2018 06/21/2017  Decreased Interest 0 0 0 0 0  Down, Depressed, Hopeless 0 0 0 0 0  PHQ - 2 Score 0 0 0 0 0  Altered sleeping - 0 0 0 -  Tired, decreased energy - 0 0 0 -  Change in appetite - 0 0 0 -  Feeling bad or failure about yourself  - 0 0 0 -  Trouble concentrating - 0 0 0 -  Moving slowly or fidgety/restless - 0 0 0 -  Suicidal thoughts - 0 0 0 -  PHQ-9 Score - 0 0 0 -  Difficult doing work/chores - Not difficult at all Not difficult at all Not difficult at all -   PHQ-2/9 Result is neg  Fall Risk: Fall Risk  11/29/2019 06/29/2019 11/22/2018 06/21/2017 02/16/2017  Falls in the past year? 0 0 1 No No  Number falls in past yr: 0 0 1 - -  Injury with Fall? 0 0 0 - -      Objective:   Vitals:   11/29/19 0955  BP: (!) 144/90  Pulse: 75  Resp: 16  Temp: 97.7 F (36.5 C)  TempSrc: Oral  SpO2: 93%  Weight: 190  lb (86.2 kg)  Height: 5\' 7"  (1.702 m)    Body mass index is 29.76 kg/m.   Recheck of blood pressure was 138/86 by myself on the left   Physical Exam    NAD, masked, pleasant HEENT - [place]/AT, sclera anicteric, PERRL, EOMI, conj -  non-inj'ed, poor dentition - lower, pharynx clear Neck - supple, no adenopathy, no TM, carotids 2+ and = without bruits bilat Car - RRR without m/g/r Pulm- RR and effort normal at rest, distant breath sounds, CTA without wheeze or rales Abd - soft, NT diffusely, mildly obese, ND,  Back - no CVA tenderness Ext - no LE edema,  Neuro/psychiatric - affect was not flat, appropriate with conversation             Alert and oriented             Grossly non-focal - good strength on testing extremities, sensation intact to LT in distal extremities             Speech normal   Results for orders placed or performed in visit on 11/22/18  CBC with Differential/Platelet  Result Value Ref Range   WBC 5.9 3.8 - 10.8 Thousand/uL   RBC 5.07 4.20 - 5.80 Million/uL   Hemoglobin 15.6 13.2 - 17.1 g/dL   HCT 16.1 38 - 50 %   MCV 90.5 80.0 - 100.0 fL   MCH 30.8 27.0 - 33.0 pg   MCHC 34.0 32.0 - 36.0 g/dL   RDW 09.6 04.5 - 40.9 %   Platelets 234 140 - 400 Thousand/uL   MPV 9.9 7.5 - 12.5 fL   Neutro Abs 3,752 1,500 - 7,800 cells/uL   Lymphs Abs 1,611 850 - 3,900 cells/uL   Absolute Monocytes 419 200 - 950 cells/uL   Eosinophils Absolute 59 15.0 - 500.0 cells/uL   Basophils Absolute 59 0.0 - 200.0 cells/uL   Neutrophils Relative % 63.6 %   Total Lymphocyte 27.3 %   Monocytes Relative 7.1 %   Eosinophils Relative 1.0 %   Basophils Relative 1.0 %  COMPLETE METABOLIC PANEL WITH GFR  Result Value Ref Range   Glucose, Bld 105 (H) 65 - 99 mg/dL   BUN 11 7 - 25 mg/dL   Creat 8.11 9.14 - 7.82 mg/dL   GFR, Est Non African American 97 > OR = 60 mL/min/1.65m2   GFR, Est African American 113 > OR = 60 mL/min/1.14m2   BUN/Creatinine Ratio NOT APPLICABLE 6 - 22 (calc)   Sodium  141 135 - 146 mmol/L   Potassium 4.3 3.5 - 5.3 mmol/L   Chloride 102 98 - 110 mmol/L   CO2 30 20 - 32 mmol/L   Calcium 9.5 8.6 - 10.3 mg/dL   Total Protein 6.8 6.1 - 8.1 g/dL   Albumin 4.5 3.6 - 5.1 g/dL   Globulin 2.3 1.9 - 3.7 g/dL (calc)   AG Ratio 2.0 1.0 - 2.5 (calc)   Total Bilirubin 1.1 0.2 - 1.2 mg/dL   Alkaline phosphatase (APISO) 68 35 - 144 U/L   AST 23 10 - 35 U/L   ALT 24 9 - 46 U/L  Lipid panel  Result Value Ref Range   Cholesterol 179 <200 mg/dL   HDL 62 > OR = 40 mg/dL   Triglycerides 956 (H) <150 mg/dL   LDL Cholesterol (Calc) 91 mg/dL (calc)   Total CHOL/HDL Ratio 2.9 <5.0 (calc)   Non-HDL Cholesterol (Calc) 117 <130 mg/dL (calc)  Hemoglobin O1H  Result Value Ref Range   Hgb A1c MFr Bld 5.9 (H) <5.7 % of total Hgb   Mean Plasma Glucose 123 (calc)   eAG (mmol/L) 6.8 (calc)       Assessment & Plan:   1. Essential hypertension borderline increase blood pressure on first check today, recheck was better.  Noted has been controlled in the recent past. Continue the low-dose amlodipine. Continue to monitor noted if has a chance to get a blood pressure checked on the outside at some point, doing so to ensure it is staying well controlled.  2. Mixed hyperlipidemia Last lipid panel reviewed. Is on a statin presently,  Will recheck lipid panel today Diet modifications and increasing physical activity levels important in helping in addition to the statin noted and encouraged  3. Prediabetes Noted last A1c's consistent with prediabetes. We will recheck labs today including an A1c  4. Gastroesophageal reflux disease, unspecified whether esophagitis present Doing well on the Pepcid product daily, and emphasized the importance of reflux precautions in addition to help which he is trying to do presently. Continue with the Pepcid presently  5. Tobacco abuse He noted last visit he was not ready to quit. That continues.   Continue to encourage tobacco cessation  and trying to lessen the numbers presently.  6. Alcohol use Has had success lessening his alcohol use, and noted that it is extremely important with many conditions we are following Emphasized importance to continue with the lessened alcohol use, and he notes he continues to feel better after reducing the alcohol consumption  7. Overweight (BMI 25.0-29.9) Discussed the importance of healthy weight maintenance, and he noted he is trying to lose a few pounds.   He notes he is trying to be more active to help, and strongly encouraged.  8. Vitamin D deficiency Vitamin D level mildly decreased on last check, Will check labs again today.  9.  Screening for prostate cancer We will check a PSA today with the labs after reviewing the role of PSAs and prostate cancer screening.  Prior checks have been done and have been okay. Lab Results  Component Value Date   PSA 0.3 02/16/2017   PSA 0.3 02/06/2016      Await lab results, and tentatively schedule follow-up in 6 months time, sooner as needed. He is aware that follow-up will be with a different provider, as I will be leaving the practice prior to that planned follow-up visit.     Jamelle HaringLIFFORD D Jordayn Mink, MD 11/29/19 9:59 AM

## 2019-11-29 ENCOUNTER — Ambulatory Visit: Payer: BLUE CROSS/BLUE SHIELD | Admitting: Internal Medicine

## 2019-11-29 ENCOUNTER — Encounter: Payer: Self-pay | Admitting: Internal Medicine

## 2019-11-29 ENCOUNTER — Other Ambulatory Visit: Payer: Self-pay

## 2019-11-29 VITALS — BP 144/90 | HR 75 | Temp 97.7°F | Resp 16 | Ht 67.0 in | Wt 190.0 lb

## 2019-11-29 DIAGNOSIS — Z125 Encounter for screening for malignant neoplasm of prostate: Secondary | ICD-10-CM

## 2019-11-29 DIAGNOSIS — E782 Mixed hyperlipidemia: Secondary | ICD-10-CM | POA: Diagnosis not present

## 2019-11-29 DIAGNOSIS — I10 Essential (primary) hypertension: Secondary | ICD-10-CM

## 2019-11-29 DIAGNOSIS — E559 Vitamin D deficiency, unspecified: Secondary | ICD-10-CM

## 2019-11-29 DIAGNOSIS — R7303 Prediabetes: Secondary | ICD-10-CM

## 2019-11-29 DIAGNOSIS — Z789 Other specified health status: Secondary | ICD-10-CM | POA: Insufficient documentation

## 2019-11-29 DIAGNOSIS — E663 Overweight: Secondary | ICD-10-CM

## 2019-11-29 DIAGNOSIS — K219 Gastro-esophageal reflux disease without esophagitis: Secondary | ICD-10-CM | POA: Diagnosis not present

## 2019-11-29 DIAGNOSIS — Z7289 Other problems related to lifestyle: Secondary | ICD-10-CM | POA: Insufficient documentation

## 2019-11-29 DIAGNOSIS — Z720 Tobacco use: Secondary | ICD-10-CM

## 2019-11-29 DIAGNOSIS — F1090 Alcohol use, unspecified, uncomplicated: Secondary | ICD-10-CM

## 2019-11-30 LAB — COMPLETE METABOLIC PANEL WITH GFR
AG Ratio: 2.2 (calc) (ref 1.0–2.5)
ALT: 26 U/L (ref 9–46)
AST: 25 U/L (ref 10–35)
Albumin: 4.7 g/dL (ref 3.6–5.1)
Alkaline phosphatase (APISO): 68 U/L (ref 35–144)
BUN: 11 mg/dL (ref 7–25)
CO2: 31 mmol/L (ref 20–32)
Calcium: 9.6 mg/dL (ref 8.6–10.3)
Chloride: 103 mmol/L (ref 98–110)
Creat: 0.84 mg/dL (ref 0.70–1.25)
GFR, Est African American: 110 mL/min/{1.73_m2} (ref >=60)
GFR, Est Non African American: 95 mL/min/{1.73_m2} (ref >=60)
Globulin: 2.1 g/dL (calc) (ref 1.9–3.7)
Glucose, Bld: 98 mg/dL (ref 65–99)
Potassium: 4.4 mmol/L (ref 3.5–5.3)
Sodium: 141 mmol/L (ref 135–146)
Total Bilirubin: 1 mg/dL (ref 0.2–1.2)
Total Protein: 6.8 g/dL (ref 6.1–8.1)

## 2019-11-30 LAB — CBC WITH DIFFERENTIAL/PLATELET
Absolute Monocytes: 418 cells/uL (ref 200–950)
Basophils Absolute: 48 cells/uL (ref 0–200)
Basophils Relative: 1 %
Eosinophils Absolute: 58 cells/uL (ref 15–500)
Eosinophils Relative: 1.2 %
HCT: 46.2 % (ref 38.5–50.0)
Hemoglobin: 16.2 g/dL (ref 13.2–17.1)
Lymphs Abs: 1325 cells/uL (ref 850–3900)
MCH: 32 pg (ref 27.0–33.0)
MCHC: 35.1 g/dL (ref 32.0–36.0)
MCV: 91.3 fL (ref 80.0–100.0)
MPV: 10 fL (ref 7.5–12.5)
Monocytes Relative: 8.7 %
Neutro Abs: 2952 cells/uL (ref 1500–7800)
Neutrophils Relative %: 61.5 %
Platelets: 206 10*3/uL (ref 140–400)
RBC: 5.06 10*6/uL (ref 4.20–5.80)
RDW: 12.7 % (ref 11.0–15.0)
Total Lymphocyte: 27.6 %
WBC: 4.8 10*3/uL (ref 3.8–10.8)

## 2019-11-30 LAB — HEMOGLOBIN A1C
Hgb A1c MFr Bld: 6 % of total Hgb — ABNORMAL HIGH (ref <5.7)
Mean Plasma Glucose: 126 (calc)
eAG (mmol/L): 7 (calc)

## 2019-11-30 LAB — VITAMIN D 25 HYDROXY (VIT D DEFICIENCY, FRACTURES): Vit D, 25-Hydroxy: 12 ng/mL — ABNORMAL LOW (ref 30–100)

## 2019-11-30 LAB — LIPID PANEL
Cholesterol: 173 mg/dL (ref <200)
HDL: 61 mg/dL (ref >=40)
LDL Cholesterol (Calc): 86 mg/dL (calc)
Non-HDL Cholesterol (Calc): 112 mg/dL (calc) (ref <130)
Total CHOL/HDL Ratio: 2.8 (calc) (ref <5.0)
Triglycerides: 162 mg/dL — ABNORMAL HIGH (ref <150)

## 2019-11-30 LAB — PSA: PSA: 0.29 ng/mL

## 2019-12-21 ENCOUNTER — Other Ambulatory Visit: Payer: Self-pay

## 2019-12-21 DIAGNOSIS — I10 Essential (primary) hypertension: Secondary | ICD-10-CM

## 2019-12-21 MED ORDER — AMLODIPINE BESYLATE 2.5 MG PO TABS: 2.5000 mg | ORAL_TABLET | Freq: Every day | ORAL | 1 refills | Status: DC

## 2020-01-23 ENCOUNTER — Other Ambulatory Visit: Payer: Self-pay | Admitting: Family Medicine

## 2020-01-23 DIAGNOSIS — K219 Gastro-esophageal reflux disease without esophagitis: Secondary | ICD-10-CM

## 2020-02-03 ENCOUNTER — Other Ambulatory Visit: Payer: Self-pay | Admitting: Family Medicine

## 2020-02-03 DIAGNOSIS — E7800 Pure hypercholesterolemia, unspecified: Secondary | ICD-10-CM

## 2020-06-24 ENCOUNTER — Other Ambulatory Visit: Payer: Self-pay | Admitting: Family Medicine

## 2020-06-24 DIAGNOSIS — I10 Essential (primary) hypertension: Secondary | ICD-10-CM

## 2020-06-24 MED ORDER — AMLODIPINE BESYLATE 2.5 MG PO TABS: 2.5000 mg | ORAL_TABLET | Freq: Every day | ORAL | 0 refills | Status: DC

## 2020-06-24 NOTE — Telephone Encounter (Signed)
Medication Refill - Medication: amLODipine (NORVASC) 2.5 MG tablet    Preferred Pharmacy (with phone number or street name): [id number] [place],  - [address]  Agent: Please be advised that RX refills may take up to 3 business days. We ask that you follow-up with your pharmacy.

## 2020-06-24 NOTE — Telephone Encounter (Signed)
   Notes to clinic:  Patient has appt scheduled for 07/25/2020 Review for courtesy refill until that time   Requested Prescriptions  Pending Prescriptions Disp Refills   amLODipine (NORVASC) 2.5 MG tablet 90 tablet 1    Sig: Take 1 tablet (2.5 mg total) by mouth daily.      Cardiovascular:  Calcium Channel Blockers Failed - 06/24/2020  8:10 AM      Failed - Last BP in normal range    BP Readings from Last 1 Encounters:  11/29/19 (!) 144/90          Failed - Valid encounter within last 6 months    Recent Outpatient Visits           6 months ago Essential hypertension   [facility] Jamelle Haring, MD   12 months ago Essential hypertension   [facility] Jamelle Haring, MD   1 year ago Essential hypertension   [facility] Danelle Berry, PA-C   2 years ago Pure hypercholesterolemia   [facility] Doren Custard, FNP   3 years ago Essential hypertension   [facility] Lada, Janit Bern, MD       Future Appointments             In 1 month Danelle Berry, PA-C [facility]

## 2020-06-24 NOTE — Telephone Encounter (Signed)
Pt has an appt on 07/25/20

## 2020-07-20 ENCOUNTER — Other Ambulatory Visit: Payer: Self-pay | Admitting: Family Medicine

## 2020-07-20 DIAGNOSIS — I10 Essential (primary) hypertension: Secondary | ICD-10-CM

## 2020-07-25 ENCOUNTER — Ambulatory Visit: Payer: BLUE CROSS/BLUE SHIELD | Admitting: Family Medicine

## 2020-07-25 ENCOUNTER — Encounter: Payer: Self-pay | Admitting: Family Medicine

## 2020-07-25 ENCOUNTER — Other Ambulatory Visit: Payer: Self-pay

## 2020-07-25 VITALS — BP 134/82 | HR 85 | Temp 97.6°F | Resp 16 | Ht 67.0 in | Wt 194.1 lb

## 2020-07-25 DIAGNOSIS — J439 Emphysema, unspecified: Secondary | ICD-10-CM

## 2020-07-25 DIAGNOSIS — E559 Vitamin D deficiency, unspecified: Secondary | ICD-10-CM

## 2020-07-25 DIAGNOSIS — E7800 Pure hypercholesterolemia, unspecified: Secondary | ICD-10-CM | POA: Diagnosis not present

## 2020-07-25 DIAGNOSIS — R7303 Prediabetes: Secondary | ICD-10-CM | POA: Diagnosis not present

## 2020-07-25 DIAGNOSIS — I10 Essential (primary) hypertension: Secondary | ICD-10-CM | POA: Diagnosis not present

## 2020-07-25 DIAGNOSIS — K219 Gastro-esophageal reflux disease without esophagitis: Secondary | ICD-10-CM

## 2020-07-25 DIAGNOSIS — Z114 Encounter for screening for human immunodeficiency virus [HIV]: Secondary | ICD-10-CM

## 2020-07-25 MED ORDER — ROSUVASTATIN CALCIUM 40 MG PO TABS: 40.0000 mg | ORAL_TABLET | Freq: Every day | ORAL | 3 refills | Status: DC

## 2020-07-25 MED ORDER — AMLODIPINE BESYLATE 2.5 MG PO TABS: 2.5000 mg | ORAL_TABLET | Freq: Every day | ORAL | 3 refills | Status: DC

## 2020-07-25 MED ORDER — FAMOTIDINE 20 MG PO TABS: 20.0000 mg | ORAL_TABLET | Freq: Every day | ORAL | 3 refills | Status: DC | PRN

## 2020-07-25 NOTE — Progress Notes (Signed)
Name: Martin Cook   MRN: [id number]    DOB: Sep 25, 1960   Date:07/25/2020       Progress Note  Chief Complaint  Patient presents with   Follow-up   Medication Refill   Hypertension   Hyperlipidemia   Gastroesophageal Reflux     Subjective:   Martin Cook is a 60 y.o. male, presents to clinic for routine f/up  COPD/emphysema - on scans, declined further CT screening for lung CA, current smoker for 40-50+ years, no intention to quit, has cough and hx of CAP, declined pnemococcal vax, declines any tx or inhalers, denies CP, hemoptysis, unintentional weight loss, states he's "old and not running marathons"  HTN - on amlodipine 2.5 mg  BP Readings from Last 3 Encounters:  07/25/20 134/82  11/29/19 (!) 144/90  06/29/19 138/86   HLD on crestor 40 mg daily, good compliance, no SE or concerns, been on for decades he states Lab Results  Component Value Date   CHOL 173 11/29/2019   HDL 61 11/29/2019   LDLCALC 86 11/29/2019   TRIG 162 (H) 11/29/2019   CHOLHDL 2.8 11/29/2019   No CP claudication sx, denies exertional sx, denies hx of CVA or MI  GERD on pepcid BID consistently, avoid triggering foods, no current sx    Current Outpatient Medications:    amLODipine (NORVASC) 2.5 MG tablet, Take 1 tablet (2.5 mg total) by mouth daily., Disp: 30 tablet, Rfl: 0   aspirin EC 81 MG tablet, Take 81 mg by mouth daily., Disp: , Rfl:    famotidine (PEPCID) 20 MG tablet, TAKE 1-2 TABLETS (20-40 MG TOTAL) BY MOUTH DAILY AS NEEDED FOR HEARTBURN OR INDIGESTION., Disp: 180 tablet, Rfl: 1   rosuvastatin (CRESTOR) 40 MG tablet, TAKE 1 TABLET BY MOUTH EVERY DAY, Disp: 90 tablet, Rfl: 1  Patient Active Problem List   Diagnosis Date Noted   Alcohol use 11/29/2019   Overweight (BMI 25.0-29.9) 06/29/2019   Mixed hyperlipidemia 06/29/2019   Aortic atherosclerosis (HCC) 05/22/2018   Vitamin D deficiency 05/22/2018   Prediabetes 06/21/2017   ED (erectile dysfunction) of organic origin 03/29/2017    Wheezing on auscultation 04/07/2016   Cough 04/07/2016   Tobacco abuse 12/10/2014   Hypertension 09/09/2014   GERD (gastroesophageal reflux disease) 09/09/2014    History reviewed. No pertinent surgical history.  Family History  Problem Relation Age of Onset   Stroke Mother    Hyperlipidemia Mother    Stroke Father    Deep vein thrombosis Father    Hyperlipidemia Father    Hyperlipidemia Sister    Hyperlipidemia Brother    Cancer Brother 93       bone   Cancer Brother 37       liver   Hyperlipidemia Brother    Stroke Maternal Grandfather        Pt is unsure     Social History   Tobacco Use   Smoking status: Every Day    Packs/day: 1.00    Years: 43.00    Pack years: 43.00    Types: Cigarettes   Smokeless tobacco: Never  Vaping Use   Vaping Use: Never used  Substance Use Topics   Alcohol use: No    Alcohol/week: 0.0 standard drinks    Comment: occasional   Drug use: No     No Known Allergies  Health Maintenance  Topic Date Due   COVID-19 Vaccine (1) 08/10/2020 (Originally 06/16/1965)   Zoster Vaccines- Shingrix (1 of 2) 10/25/2020 (Originally 06/16/2009)  Pneumococcal Vaccine 66-51 Years old (1 - PCV) 07/25/2021 (Originally 06/16/1965)   COLONOSCOPY (Pts 45-66yrs Insurance coverage will need to be confirmed)  07/25/2021 (Originally 06/16/2004)   TETANUS/TDAP  07/25/2021 (Originally 06/17/1978)   INFLUENZA VACCINE  08/18/2020   Hepatitis C Screening  Completed   HIV Screening  Completed   HPV VACCINES  Aged Out    Chart Review Today: I personally reviewed active problem list, medication list, allergies, family history, social history, health maintenance, notes from last encounter, lab results, imaging with the patient/caregiver today.   Review of Systems  Constitutional: Negative.   HENT: Negative.    Eyes: Negative.   Respiratory: Negative.    Cardiovascular: Negative.   Gastrointestinal: Negative.   Endocrine: Negative.   Genitourinary: Negative.    Musculoskeletal: Negative.   Skin: Negative.   Allergic/Immunologic: Negative.   Neurological: Negative.   Hematological: Negative.   Psychiatric/Behavioral: Negative.    All other systems reviewed and are negative.   Objective:   Vitals:   07/25/20 0851  BP: 134/82  Pulse: 85  Resp: 16  Temp: 97.6 F (36.4 C)  SpO2: 93%  Weight: 194 lb 1.6 oz (88 kg)  Height: 5\' 7"  (1.702 m)    Body mass index is 30.4 kg/m.  Physical Exam Vitals and nursing note reviewed.  Constitutional:      General: He is not in acute distress.    Appearance: Normal appearance. He is well-developed. He is obese. He is not ill-appearing, toxic-appearing or diaphoretic.     Interventions: Face mask in place.  HENT:     Head: Normocephalic and atraumatic.     Jaw: No trismus.     Right Ear: External ear normal.     Left Ear: External ear normal.  Eyes:     General: Lids are normal. No scleral icterus.       Right eye: No discharge.        Left eye: No discharge.     Conjunctiva/sclera: Conjunctivae normal.  Neck:     Trachea: Trachea and phonation normal. No tracheal deviation.  Cardiovascular:     Rate and Rhythm: Normal rate and regular rhythm.     Pulses: Normal pulses.          Radial pulses are 2+ on the right side and 2+ on the left side.       Posterior tibial pulses are 2+ on the right side and 2+ on the left side.     Heart sounds: Normal heart sounds. No murmur heard.   No friction rub. No gallop.  Pulmonary:     Effort: Pulmonary effort is normal. No respiratory distress.     Breath sounds: No stridor. Wheezing and rhonchi present. No rales.  Abdominal:     General: Bowel sounds are normal. There is no distension.     Palpations: Abdomen is soft.  Musculoskeletal:     Right lower leg: No edema.     Left lower leg: No edema.  Skin:    General: Skin is warm and dry.     Coloration: Skin is not jaundiced or pale.     Findings: No rash.     Nails: There is no clubbing.   Neurological:     Mental Status: He is alert.     Cranial Nerves: No dysarthria or facial asymmetry.     Motor: No tremor or abnormal muscle tone.     Gait: Gait normal.  Psychiatric:        Mood  and Affect: Mood normal.        Speech: Speech normal.        Behavior: Behavior normal. Behavior is cooperative.        Assessment & Plan:     ICD-10-CM   1. Essential hypertension  I10 amLODipine (NORVASC) 2.5 MG tablet    COMPLETE METABOLIC PANEL WITH GFR   well controlled, stable, BP at goal today    2. Gastro-esophageal reflux disease without esophagitis  K21.9 famotidine (PEPCID) 20 MG tablet   on pepcid BID diet eforts as well to avoid sx    3. Pure hypercholesterolemia  E78.00 rosuvastatin (CRESTOR) 40 MG tablet    COMPLETE METABOLIC PANEL WITH GFR    Lipid panel   on crestor, well controlled, same med for years    4. Prediabetes  R73.03 COMPLETE METABOLIC PANEL WITH GFR    Hemoglobin A1C   stable, recheck today no change to diet or weight    5. Screening for HIV without presence of risk factors  Z11.4 HIV antibody (with reflex)    6. Vitamin D deficiency  E55.9 COMPLETE METABOLIC PANEL WITH GFR    VITAMIN D 25 Hydroxy (Vit-D Deficiency, Fractures)   taking one supplement daily, he will check dose, hopes levels are better now with more consistent supplementation    7. Pulmonary emphysema, unspecified emphysema type (HCC)  J43.9    sig smoking hx, evident sx, but pt declines any inhalers, tx, of monitoring or testing for pulm disease       Return in about 6 months (around 01/25/2021) for Routine follow-up HTN, lungs/breathing.   Danelle Berry, PA-C 07/25/20 9:14 AM

## 2020-07-28 LAB — HEMOGLOBIN A1C
Hgb A1c MFr Bld: 6 % of total Hgb — ABNORMAL HIGH (ref <5.7)
Mean Plasma Glucose: 126 mg/dL
eAG (mmol/L): 7 mmol/L

## 2020-07-28 LAB — HIV ANTIBODY (ROUTINE TESTING W REFLEX): HIV 1&2 Ab, 4th Generation: NONREACTIVE

## 2020-07-28 LAB — COMPLETE METABOLIC PANEL WITH GFR
AG Ratio: 1.9 (calc) (ref 1.0–2.5)
ALT: 23 U/L (ref 9–46)
AST: 23 U/L (ref 10–35)
Albumin: 4.4 g/dL (ref 3.6–5.1)
Alkaline phosphatase (APISO): 59 U/L (ref 35–144)
BUN: 12 mg/dL (ref 7–25)
CO2: 30 mmol/L (ref 20–32)
Calcium: 9.5 mg/dL (ref 8.6–10.3)
Chloride: 104 mmol/L (ref 98–110)
Creat: 0.8 mg/dL (ref 0.70–1.25)
GFR, Est African American: 112 mL/min/{1.73_m2} (ref >=60)
GFR, Est Non African American: 96 mL/min/{1.73_m2} (ref >=60)
Globulin: 2.3 g/dL (calc) (ref 1.9–3.7)
Glucose, Bld: 98 mg/dL (ref 65–99)
Potassium: 4.3 mmol/L (ref 3.5–5.3)
Sodium: 140 mmol/L (ref 135–146)
Total Bilirubin: 0.8 mg/dL (ref 0.2–1.2)
Total Protein: 6.7 g/dL (ref 6.1–8.1)

## 2020-07-28 LAB — LIPID PANEL
Cholesterol: 160 mg/dL (ref <200)
HDL: 65 mg/dL (ref >=40)
LDL Cholesterol (Calc): 72 mg/dL (calc)
Non-HDL Cholesterol (Calc): 95 mg/dL (calc) (ref <130)
Total CHOL/HDL Ratio: 2.5 (calc) (ref <5.0)
Triglycerides: 151 mg/dL — ABNORMAL HIGH (ref <150)

## 2020-07-28 LAB — VITAMIN D 25 HYDROXY (VIT D DEFICIENCY, FRACTURES): Vit D, 25-Hydroxy: 35 ng/mL (ref 30–100)

## 2021-01-27 ENCOUNTER — Ambulatory Visit: Payer: BLUE CROSS/BLUE SHIELD | Admitting: Unknown Physician Specialty

## 2021-01-27 ENCOUNTER — Encounter: Payer: Self-pay | Admitting: Unknown Physician Specialty

## 2021-01-27 VITALS — BP 172/88 | HR 87 | Temp 97.7°F | Resp 16 | Ht 67.0 in | Wt 199.2 lb

## 2021-01-27 DIAGNOSIS — I10 Essential (primary) hypertension: Secondary | ICD-10-CM

## 2021-01-27 DIAGNOSIS — E8881 Metabolic syndrome: Secondary | ICD-10-CM | POA: Diagnosis not present

## 2021-01-27 DIAGNOSIS — Z789 Other specified health status: Secondary | ICD-10-CM

## 2021-01-27 DIAGNOSIS — R7303 Prediabetes: Secondary | ICD-10-CM

## 2021-01-27 DIAGNOSIS — E663 Overweight: Secondary | ICD-10-CM

## 2021-01-27 DIAGNOSIS — E782 Mixed hyperlipidemia: Secondary | ICD-10-CM

## 2021-01-27 DIAGNOSIS — K219 Gastro-esophageal reflux disease without esophagitis: Secondary | ICD-10-CM

## 2021-01-27 MED ORDER — AMLODIPINE BESYLATE 5 MG PO TABS: 5.0000 mg | ORAL_TABLET | Freq: Every day | ORAL | 0 refills | Status: DC

## 2021-01-27 NOTE — Progress Notes (Signed)
BP (!) 172/88    Pulse 87    Temp 97.7 F (36.5 C) (Oral)    Resp 16    Ht 5\' 7"  (1.702 m)    Wt 199 lb 3.2 oz (90.4 kg)    SpO2 95%    BMI 31.20 kg/m    Subjective:    Patient ID: Martin Cook, male    DOB: Jan 17, 1961, 61 y.o.   MRN: [id number]  HPI: Martin Cook is a 61 y.o. male  Chief Complaint  Patient presents with   Follow-up   Hypertension   Follow-up     Medication Refill   Hypertension   Hyperlipidemia   Gastroesophageal Reflux    Pt needing refills on the above medications.    Hypertension Pt states he got himself worked up before he came.  States  Using medications without difficulty Average home BPs not checking  No problems or lightheadedness No chest pain with exertion or shortness of breath No Edema  Hyperlipidemia Using medications without problems: No Muscle aches  Diet compliance: Eats "too much fast food"  but doesn't eat "crap" Exercise: Does not exercise  GERD Takes the same for 15 years and not having trouble.    Relevant past medical, surgical, family and social history reviewed and updated as indicated. Interim medical history since our last visit reviewed. Allergies and medications reviewed and updated.  Review of Systems  Constitutional: Negative.   Respiratory: Negative.    Cardiovascular: Negative.   Psychiatric/Behavioral: Negative.     Per HPI unless specifically indicated above     Objective:    BP (!) 172/88    Pulse 87    Temp 97.7 F (36.5 C) (Oral)    Resp 16    Ht 5\' 7"  (1.702 m)    Wt 199 lb 3.2 oz (90.4 kg)    SpO2 95%    BMI 31.20 kg/m   Wt Readings from Last 3 Encounters:  01/27/21 199 lb 3.2 oz (90.4 kg)  07/25/20 194 lb 1.6 oz (88 kg)  11/29/19 190 lb (86.2 kg)    Physical Exam Constitutional:      General: He is not in acute distress.    Appearance: Normal appearance. He is well-developed.  HENT:     Head: Normocephalic and atraumatic.  Eyes:     General: Lids are normal. No scleral icterus.        Right eye: No discharge.        Left eye: No discharge.     Conjunctiva/sclera: Conjunctivae normal.  Neck:     Vascular: No carotid bruit or JVD.  Cardiovascular:     Rate and Rhythm: Normal rate and regular rhythm.     Heart sounds: Normal heart sounds.  Pulmonary:     Effort: Pulmonary effort is normal. No respiratory distress.     Breath sounds: Normal breath sounds.  Abdominal:     Palpations: There is no hepatomegaly or splenomegaly.  Musculoskeletal:        General: Normal range of motion.     Cervical back: Normal range of motion and neck supple.  Skin:    General: Skin is warm and dry.     Coloration: Skin is not pale.     Findings: No rash.  Neurological:     Mental Status: He is alert and oriented to person, place, and time.  Psychiatric:        Behavior: Behavior normal.        Thought Content:  Thought content normal.        Judgment: Judgment normal.    Results for orders placed or performed in visit on 07/25/20  COMPLETE METABOLIC PANEL WITH GFR  Result Value Ref Range   Glucose, Bld 98 65 - 99 mg/dL   BUN 12 7 - 25 mg/dL   Creat 0.80 0.70 - 1.25 mg/dL   GFR, Est Non African American 96 > OR = 60 mL/min/1.77m2   GFR, Est African American 112 > OR = 60 mL/min/1.37m2   BUN/Creatinine Ratio NOT APPLICABLE 6 - 22 (calc)   Sodium 140 135 - 146 mmol/L   Potassium 4.3 3.5 - 5.3 mmol/L   Chloride 104 98 - 110 mmol/L   CO2 30 20 - 32 mmol/L   Calcium 9.5 8.6 - 10.3 mg/dL   Total Protein 6.7 6.1 - 8.1 g/dL   Albumin 4.4 3.6 - 5.1 g/dL   Globulin 2.3 1.9 - 3.7 g/dL (calc)   AG Ratio 1.9 1.0 - 2.5 (calc)   Total Bilirubin 0.8 0.2 - 1.2 mg/dL   Alkaline phosphatase (APISO) 59 35 - 144 U/L   AST 23 10 - 35 U/L   ALT 23 9 - 46 U/L  Lipid panel  Result Value Ref Range   Cholesterol 160 <200 mg/dL   HDL 65 > OR = 40 mg/dL   Triglycerides 151 (H) <150 mg/dL   LDL Cholesterol (Calc) 72 mg/dL (calc)   Total CHOL/HDL Ratio 2.5 <5.0 (calc)   Non-HDL Cholesterol (Calc)  95 <130 mg/dL (calc)  Hemoglobin A1C  Result Value Ref Range   Hgb A1c MFr Bld 6.0 (H) <5.7 % of total Hgb   Mean Plasma Glucose 126 mg/dL   eAG (mmol/L) 7.0 mmol/L  HIV antibody (with reflex)  Result Value Ref Range   HIV 1&2 Ab, 4th Generation NON-REACTIVE NON-REACTIVE  VITAMIN D 25 Hydroxy (Vit-D Deficiency, Fractures)  Result Value Ref Range   Vit D, 25-Hydroxy 35 30 - 100 ng/mL      Assessment & Plan:   Problem List Items Addressed This Visit       Unprioritized   Alcohol use    States he has "slowed this down" with 4-5 beers on the weekend.  Recommended no more than 1-2/day      GERD (gastroesophageal reflux disease)    Stable, continue present medications.        Hypertension - Primary    BP is vry high and not to goal.  He is not interested in taking it at home.  Increase Amlodipine to 5 mg.  He is concerned about making rapid changes.  He will take 2 of what he has.  Recheck in 4 weeks.        Relevant Medications   amLODipine (NORVASC) 5 MG tablet   Other Relevant Orders   COMPLETE METABOLIC PANEL WITH GFR   Mixed hyperlipidemia    This has been stable.  Check lipid panel      Relevant Medications   amLODipine (NORVASC) 5 MG tablet   Other Relevant Orders   Lipid panel   Overweight (BMI 25.0-29.9)   Prediabetes    Check Hgb A1C today      Other Visit Diagnoses     Metabolic syndrome       Relevant Orders   COMPLETE METABOLIC PANEL WITH GFR   Lipid panel   Hemoglobin A1c        Follow up plan: Return in about 4 weeks (around 02/24/2021).

## 2021-01-27 NOTE — Assessment & Plan Note (Signed)
States he has "slowed this down" with 4-5 beers on the weekend.  Recommended no more than 1-2/day

## 2021-01-27 NOTE — Assessment & Plan Note (Signed)
Check Hgb A1C today 

## 2021-01-27 NOTE — Assessment & Plan Note (Addendum)
BP is vry high and not to goal.  He is not interested in taking it at home.  Increase Amlodipine to 5 mg.  He is concerned about making rapid changes.  He will take 2 of what he has.  Recheck in 4 weeks.

## 2021-01-27 NOTE — Assessment & Plan Note (Signed)
This has been stable.  Check lipid panel

## 2021-01-27 NOTE — Assessment & Plan Note (Signed)
Stable, continue present medications.   

## 2021-01-28 DIAGNOSIS — E8881 Metabolic syndrome: Secondary | ICD-10-CM | POA: Diagnosis not present

## 2021-01-28 DIAGNOSIS — I10 Essential (primary) hypertension: Secondary | ICD-10-CM | POA: Diagnosis not present

## 2021-01-28 DIAGNOSIS — E782 Mixed hyperlipidemia: Secondary | ICD-10-CM | POA: Diagnosis not present

## 2021-01-29 LAB — LIPID PANEL
Cholesterol: 169 mg/dL (ref <200)
HDL: 60 mg/dL (ref >=40)
LDL Cholesterol (Calc): 81 mg/dL (calc)
Non-HDL Cholesterol (Calc): 109 mg/dL (calc) (ref <130)
Total CHOL/HDL Ratio: 2.8 (calc) (ref <5.0)
Triglycerides: 179 mg/dL — ABNORMAL HIGH (ref <150)

## 2021-01-29 LAB — COMPLETE METABOLIC PANEL WITH GFR
AG Ratio: 2.1 (calc) (ref 1.0–2.5)
ALT: 19 U/L (ref 9–46)
AST: 20 U/L (ref 10–35)
Albumin: 4.5 g/dL (ref 3.6–5.1)
Alkaline phosphatase (APISO): 59 U/L (ref 35–144)
BUN: 13 mg/dL (ref 7–25)
CO2: 31 mmol/L (ref 20–32)
Calcium: 9.7 mg/dL (ref 8.6–10.3)
Chloride: 104 mmol/L (ref 98–110)
Creat: 0.8 mg/dL (ref 0.70–1.35)
Globulin: 2.1 g/dL (calc) (ref 1.9–3.7)
Glucose, Bld: 97 mg/dL (ref 65–99)
Potassium: 4.2 mmol/L (ref 3.5–5.3)
Sodium: 141 mmol/L (ref 135–146)
Total Bilirubin: 0.9 mg/dL (ref 0.2–1.2)
Total Protein: 6.6 g/dL (ref 6.1–8.1)
eGFR: 101 mL/min/{1.73_m2} (ref >=60)

## 2021-01-29 LAB — HEMOGLOBIN A1C
Hgb A1c MFr Bld: 6.3 % of total Hgb — ABNORMAL HIGH (ref <5.7)
Mean Plasma Glucose: 134 mg/dL
eAG (mmol/L): 7.4 mmol/L

## 2021-02-24 ENCOUNTER — Encounter: Payer: Self-pay | Admitting: Unknown Physician Specialty

## 2021-02-24 ENCOUNTER — Ambulatory Visit: Payer: BLUE CROSS/BLUE SHIELD | Admitting: Unknown Physician Specialty

## 2021-02-24 DIAGNOSIS — I10 Essential (primary) hypertension: Secondary | ICD-10-CM

## 2021-02-24 NOTE — Progress Notes (Signed)
BP (!) 144/78    Pulse 80    Temp 97.7 F (36.5 C) (Oral)    Resp 16    Ht 5' 7"  (1.702 m)    Wt 198 lb 14.4 oz (90.2 kg)    SpO2 95%    BMI 31.15 kg/m    Subjective:    Patient ID: Martin Cook, male    DOB: 1960-06-21, 61 y.o.   MRN: [id number]  HPI: Martin Cook is a 61 y.o. male  Chief Complaint  Patient presents with   Follow-up   Hypertension   Hypertension Last visit increased Amlodipine to 5 mg.  Not checking at home but went to the dentist and told his BP was OK.  Slowed down on drinking and feels better  No problems or lightheadedness No chest pain with exertion or shortness of breath No Edema  Relevant past medical, surgical, family and social history reviewed and updated as indicated. Interim medical history since our last visit reviewed. Allergies and medications reviewed and updated.  Review of Systems  Per HPI unless specifically indicated above     Objective:    BP (!) 144/78    Pulse 80    Temp 97.7 F (36.5 C) (Oral)    Resp 16    Ht 5' 7"  (1.702 m)    Wt 198 lb 14.4 oz (90.2 kg)    SpO2 95%    BMI 31.15 kg/m   Wt Readings from Last 3 Encounters:  02/24/21 198 lb 14.4 oz (90.2 kg)  01/27/21 199 lb 3.2 oz (90.4 kg)  07/25/20 194 lb 1.6 oz (88 kg)    Physical Exam Constitutional:      General: He is not in acute distress.    Appearance: Normal appearance. He is well-developed.  HENT:     Head: Normocephalic and atraumatic.  Eyes:     General: Lids are normal. No scleral icterus.       Right eye: No discharge.        Left eye: No discharge.     Conjunctiva/sclera: Conjunctivae normal.  Neck:     Vascular: No carotid bruit or JVD.  Cardiovascular:     Rate and Rhythm: Normal rate and regular rhythm.     Heart sounds: Normal heart sounds.  Pulmonary:     Effort: Pulmonary effort is normal. No respiratory distress.     Breath sounds: Normal breath sounds.  Abdominal:     Palpations: There is no hepatomegaly or splenomegaly.   Musculoskeletal:        General: Normal range of motion.     Cervical back: Normal range of motion and neck supple.  Skin:    General: Skin is warm and dry.     Coloration: Skin is not pale.     Findings: No rash.  Neurological:     Mental Status: He is alert and oriented to person, place, and time.  Psychiatric:        Behavior: Behavior normal.        Thought Content: Thought content normal.        Judgment: Judgment normal.    Results for orders placed or performed in visit on 01/27/21  COMPLETE METABOLIC PANEL WITH GFR  Result Value Ref Range   Glucose, Bld 97 65 - 99 mg/dL   BUN 13 7 - 25 mg/dL   Creat 0.80 0.70 - 1.35 mg/dL   eGFR 101 > OR = 60 mL/min/1.28m   BUN/Creatinine Ratio NOT APPLICABLE 6 -  22 (calc)   Sodium 141 135 - 146 mmol/L   Potassium 4.2 3.5 - 5.3 mmol/L   Chloride 104 98 - 110 mmol/L   CO2 31 20 - 32 mmol/L   Calcium 9.7 8.6 - 10.3 mg/dL   Total Protein 6.6 6.1 - 8.1 g/dL   Albumin 4.5 3.6 - 5.1 g/dL   Globulin 2.1 1.9 - 3.7 g/dL (calc)   AG Ratio 2.1 1.0 - 2.5 (calc)   Total Bilirubin 0.9 0.2 - 1.2 mg/dL   Alkaline phosphatase (APISO) 59 35 - 144 U/L   AST 20 10 - 35 U/L   ALT 19 9 - 46 U/L  Lipid panel  Result Value Ref Range   Cholesterol 169 <200 mg/dL   HDL 60 > OR = 40 mg/dL   Triglycerides 179 (H) <150 mg/dL   LDL Cholesterol (Calc) 81 mg/dL (calc)   Total CHOL/HDL Ratio 2.8 <5.0 (calc)   Non-HDL Cholesterol (Calc) 109 <130 mg/dL (calc)  Hemoglobin A1c  Result Value Ref Range   Hgb A1c MFr Bld 6.3 (H) <5.7 % of total Hgb   Mean Plasma Glucose 134 mg/dL   eAG (mmol/L) 7.4 mmol/L      Assessment & Plan:   Problem List Items Addressed This Visit       Unprioritized   Hypertension    BP much improved but not to goal.  Pt reluctant to add more medications.  Recommended home monitoring and will f/u at least every 6 months here        Follow up plan: Return in about 3 months (around 05/24/2021).

## 2021-02-24 NOTE — Assessment & Plan Note (Signed)
BP much improved but not to goal.  Pt reluctant to add more medications.  Recommended home monitoring and will f/u at least every 6 months here

## 2021-03-09 ENCOUNTER — Telehealth: Payer: Self-pay | Admitting: Internal Medicine

## 2021-03-09 NOTE — Telephone Encounter (Signed)
Medication Refill - Medication:   amLODipine (NORVASC) 5 MG tablet  Has the patient contacted their pharmacy? Yes.   Pt stated they did not have this Rx [facility] file at [facility].  (Agent: If no, request that the patient contact the pharmacy for the refill. If patient does not wish to contact the pharmacy document the reason why and proceed with request.) (Agent: If yes, when and what did the pharmacy advise?)  Preferred Pharmacy (with phone number or street name):   [id number] - [place], [address]  [address] [address]  Phone: [phone] Fax: [phone]    Has the patient been seen for an appointment in the last year OR does the patient have an upcoming appointment? Yes.    Agent: Please be advised that RX refills may take up to 3 business days. We ask that you follow-up with your pharmacy.

## 2021-03-10 ENCOUNTER — Other Ambulatory Visit: Payer: Self-pay | Admitting: Emergency Medicine

## 2021-03-10 MED ORDER — AMLODIPINE BESYLATE 5 MG PO TABS: 5.0000 mg | ORAL_TABLET | Freq: Every day | ORAL | 0 refills | Status: DC

## 2021-03-10 NOTE — Telephone Encounter (Signed)
Medication was filled by provider today for #90/0  Requested Prescriptions  Pending Prescriptions Disp Refills   amLODipine (NORVASC) 5 MG tablet 90 tablet 0    Sig: Take 1 tablet (5 mg total) by mouth daily.     Cardiovascular: Calcium Channel Blockers 2 Failed - 03/09/2021 11:33 AM      Failed - Last BP in normal range    BP Readings from Last 1 Encounters:  02/24/21 (!) 144/78         Passed - Last Heart Rate in normal range    Pulse Readings from Last 1 Encounters:  02/24/21 80         Passed - Valid encounter within last 6 months    Recent Outpatient Visits          2 weeks ago Primary hypertension   [facility] Gabriel Cirri, NP   1 month ago Primary hypertension   [facility] Gabriel Cirri, NP   7 months ago Essential hypertension   [facility] Danelle Berry, PA-C   1 year ago Essential hypertension   [facility] Jamelle Haring, MD   1 year ago Essential hypertension   [facility] Jamelle Haring, MD      Future Appointments            In 2 months Margarita Mail, DO [facility]

## 2021-03-11 ENCOUNTER — Telehealth: Payer: Self-pay

## 2021-03-11 MED ORDER — AMLODIPINE BESYLATE 5 MG PO TABS: 5.0000 mg | ORAL_TABLET | Freq: Every day | ORAL | 0 refills | Status: DC

## 2021-03-11 NOTE — Telephone Encounter (Signed)
Pt had called in and spoke with Agent, states medication amlodipine was supposed to be sent to [facility] yesterday but pt went and medication hadn't been received. This NT was able to resend to pharmacy. No other questions needed.

## 2021-05-25 ENCOUNTER — Ambulatory Visit: Payer: Self-pay

## 2021-05-25 ENCOUNTER — Emergency Department
Admission: EM | Admit: 2021-05-25 | Discharge: 2021-05-25 | Disposition: A | Discharge status: Home / Self Care | Payer: BLUE CROSS/BLUE SHIELD | Source: Home / Self Care | Attending: Emergency Medicine | Admitting: Emergency Medicine

## 2021-05-25 ENCOUNTER — Inpatient Hospital Stay: Payer: BLUE CROSS/BLUE SHIELD

## 2021-05-25 ENCOUNTER — Other Ambulatory Visit: Payer: Self-pay

## 2021-05-25 ENCOUNTER — Inpatient Hospital Stay
Admission: EM | Admit: 2021-05-25 | Discharge: 2021-05-26 | DRG: 123 | Disposition: A | Discharge status: Home / Self Care | Payer: BLUE CROSS/BLUE SHIELD | Attending: Internal Medicine | Admitting: Internal Medicine

## 2021-05-25 ENCOUNTER — Encounter: Payer: Self-pay | Admitting: Internal Medicine

## 2021-05-25 ENCOUNTER — Emergency Department: Payer: BLUE CROSS/BLUE SHIELD

## 2021-05-25 DIAGNOSIS — Z83438 Family history of other disorder of lipoprotein metabolism and other lipidemia: Secondary | ICD-10-CM

## 2021-05-25 DIAGNOSIS — I6501 Occlusion and stenosis of right vertebral artery: Secondary | ICD-10-CM | POA: Diagnosis not present

## 2021-05-25 DIAGNOSIS — I6359 Cerebral infarction due to unspecified occlusion or stenosis of other cerebral artery: Secondary | ICD-10-CM | POA: Diagnosis not present

## 2021-05-25 DIAGNOSIS — F1090 Alcohol use, unspecified, uncomplicated: Secondary | ICD-10-CM | POA: Diagnosis not present

## 2021-05-25 DIAGNOSIS — Z8673 Personal history of transient ischemic attack (TIA), and cerebral infarction without residual deficits: Secondary | ICD-10-CM | POA: Diagnosis not present

## 2021-05-25 DIAGNOSIS — F17210 Nicotine dependence, cigarettes, uncomplicated: Secondary | ICD-10-CM | POA: Diagnosis not present

## 2021-05-25 DIAGNOSIS — Z789 Other specified health status: Secondary | ICD-10-CM | POA: Diagnosis not present

## 2021-05-25 DIAGNOSIS — H547 Unspecified visual loss: Secondary | ICD-10-CM | POA: Insufficient documentation

## 2021-05-25 DIAGNOSIS — H5462 Unqualified visual loss, left eye, normal vision right eye: Secondary | ICD-10-CM | POA: Diagnosis not present

## 2021-05-25 DIAGNOSIS — D582 Other hemoglobinopathies: Secondary | ICD-10-CM

## 2021-05-25 DIAGNOSIS — K219 Gastro-esophageal reflux disease without esophagitis: Secondary | ICD-10-CM | POA: Diagnosis not present

## 2021-05-25 DIAGNOSIS — Z0100 Encounter for examination of eyes and vision without abnormal findings: Secondary | ICD-10-CM | POA: Diagnosis not present

## 2021-05-25 DIAGNOSIS — F17200 Nicotine dependence, unspecified, uncomplicated: Secondary | ICD-10-CM | POA: Insufficient documentation

## 2021-05-25 DIAGNOSIS — I10 Essential (primary) hypertension: Secondary | ICD-10-CM | POA: Diagnosis not present

## 2021-05-25 DIAGNOSIS — H3412 Central retinal artery occlusion, left eye: Principal | ICD-10-CM | POA: Diagnosis present

## 2021-05-25 DIAGNOSIS — I771 Stricture of artery: Secondary | ICD-10-CM | POA: Diagnosis not present

## 2021-05-25 DIAGNOSIS — Z808 Family history of malignant neoplasm of other organs or systems: Secondary | ICD-10-CM

## 2021-05-25 DIAGNOSIS — Z800 Family history of malignant neoplasm of digestive organs: Secondary | ICD-10-CM

## 2021-05-25 DIAGNOSIS — Z720 Tobacco use: Secondary | ICD-10-CM | POA: Diagnosis not present

## 2021-05-25 DIAGNOSIS — Z823 Family history of stroke: Secondary | ICD-10-CM

## 2021-05-25 DIAGNOSIS — I6523 Occlusion and stenosis of bilateral carotid arteries: Secondary | ICD-10-CM | POA: Diagnosis not present

## 2021-05-25 DIAGNOSIS — I700 Atherosclerosis of aorta: Secondary | ICD-10-CM | POA: Diagnosis not present

## 2021-05-25 DIAGNOSIS — I6381 Other cerebral infarction due to occlusion or stenosis of small artery: Secondary | ICD-10-CM | POA: Diagnosis not present

## 2021-05-25 DIAGNOSIS — E669 Obesity, unspecified: Secondary | ICD-10-CM | POA: Diagnosis not present

## 2021-05-25 DIAGNOSIS — Z7982 Long term (current) use of aspirin: Secondary | ICD-10-CM | POA: Diagnosis not present

## 2021-05-25 DIAGNOSIS — E782 Mixed hyperlipidemia: Secondary | ICD-10-CM | POA: Diagnosis present

## 2021-05-25 DIAGNOSIS — Z79899 Other long term (current) drug therapy: Secondary | ICD-10-CM | POA: Diagnosis not present

## 2021-05-25 DIAGNOSIS — H3410 Central retinal artery occlusion, unspecified eye: Secondary | ICD-10-CM | POA: Diagnosis present

## 2021-05-25 DIAGNOSIS — Z6831 Body mass index (BMI) 31.0-31.9, adult: Secondary | ICD-10-CM

## 2021-05-25 DIAGNOSIS — I672 Cerebral atherosclerosis: Secondary | ICD-10-CM | POA: Diagnosis not present

## 2021-05-25 DIAGNOSIS — R9431 Abnormal electrocardiogram [ECG] [EKG]: Secondary | ICD-10-CM | POA: Diagnosis not present

## 2021-05-25 DIAGNOSIS — R7303 Prediabetes: Secondary | ICD-10-CM | POA: Diagnosis present

## 2021-05-25 DIAGNOSIS — I6503 Occlusion and stenosis of bilateral vertebral arteries: Secondary | ICD-10-CM | POA: Diagnosis not present

## 2021-05-25 LAB — COMPREHENSIVE METABOLIC PANEL
ALT: 27 U/L (ref 0–44)
AST: 33 U/L (ref 15–41)
Albumin: 4.6 g/dL (ref 3.5–5.0)
Alkaline Phosphatase: 67 U/L (ref 38–126)
Anion gap: 11 (ref 5–15)
BUN: 13 mg/dL (ref 8–23)
CO2: 26 mmol/L (ref 22–32)
Calcium: 9.6 mg/dL (ref 8.9–10.3)
Chloride: 102 mmol/L (ref 98–111)
Creatinine, Ser: 0.85 mg/dL (ref 0.61–1.24)
GFR, Estimated: >60 mL/min (ref >60)
Glucose, Bld: 132 mg/dL — ABNORMAL HIGH (ref 70–99)
Potassium: 3.7 mmol/L (ref 3.5–5.1)
Sodium: 139 mmol/L (ref 135–145)
Total Bilirubin: 1.1 mg/dL (ref 0.3–1.2)
Total Protein: 8 g/dL (ref 6.5–8.1)

## 2021-05-25 LAB — CBC WITH DIFFERENTIAL/PLATELET
Abs Immature Granulocytes: 0.03 10*3/uL (ref 0.00–0.07)
Basophils Absolute: 0.1 10*3/uL (ref 0.0–0.1)
Basophils Relative: 1 %
Eosinophils Absolute: 0.1 10*3/uL (ref 0.0–0.5)
Eosinophils Relative: 1 %
HCT: 52.5 % — ABNORMAL HIGH (ref 39.0–52.0)
Hemoglobin: 17.2 g/dL — ABNORMAL HIGH (ref 13.0–17.0)
Immature Granulocytes: 0 %
Lymphocytes Relative: 19 %
Lymphs Abs: 1.4 10*3/uL (ref 0.7–4.0)
MCH: 30.9 pg (ref 26.0–34.0)
MCHC: 32.8 g/dL (ref 30.0–36.0)
MCV: 94.4 fL (ref 80.0–100.0)
Monocytes Absolute: 0.5 10*3/uL (ref 0.1–1.0)
Monocytes Relative: 7 %
Neutro Abs: 5.6 10*3/uL (ref 1.7–7.7)
Neutrophils Relative %: 72 %
Platelets: 202 10*3/uL (ref 150–400)
RBC: 5.56 MIL/uL (ref 4.22–5.81)
RDW: 12.9 % (ref 11.5–15.5)
WBC: 7.7 10*3/uL (ref 4.0–10.5)
nRBC: 0 % (ref 0.0–0.2)

## 2021-05-25 LAB — APTT: aPTT: 31 seconds (ref 24–36)

## 2021-05-25 LAB — PROTIME-INR
INR: 1 (ref 0.8–1.2)
Prothrombin Time: 12.7 seconds (ref 11.4–15.2)

## 2021-05-25 LAB — C-REACTIVE PROTEIN: CRP: <0.5 mg/dL (ref <1.0)

## 2021-05-25 LAB — SEDIMENTATION RATE: Sed Rate: 2 mm/hr (ref 0–20)

## 2021-05-25 MED ORDER — SENNOSIDES-DOCUSATE SODIUM 8.6-50 MG PO TABS: 1.0000 | ORAL_TABLET | Freq: Every evening | ORAL | Status: DC | PRN

## 2021-05-25 MED ORDER — FAMOTIDINE 20 MG PO TABS
20.0000 mg | ORAL_TABLET | Freq: Every day | ORAL | Status: DC | PRN
  Filled 2021-05-25: qty 1

## 2021-05-25 MED ORDER — LORAZEPAM 1 MG PO TABS
1.0000 mg | ORAL_TABLET | Freq: Four times a day (QID) | ORAL | Status: DC | PRN
  Administered 2021-05-25: 1 mg via ORAL
  Filled 2021-05-25 (×2): qty 1

## 2021-05-25 MED ORDER — ROSUVASTATIN CALCIUM 20 MG PO TABS
40.0000 mg | ORAL_TABLET | Freq: Every day | ORAL | Status: DC
  Administered 2021-05-26: 40 mg via ORAL
  Filled 2021-05-25: qty 2

## 2021-05-25 MED ORDER — AMLODIPINE BESYLATE 5 MG PO TABS
5.0000 mg | ORAL_TABLET | Freq: Every day | ORAL | Status: DC
  Administered 2021-05-25 – 2021-05-26 (×2): 5 mg via ORAL
  Filled 2021-05-25 (×2): qty 1

## 2021-05-25 MED ORDER — IOHEXOL 350 MG/ML SOLN
75.0000 mL | Freq: Once | INTRAVENOUS | Status: AC | PRN
  Administered 2021-05-25: 75 mL via INTRAVENOUS

## 2021-05-25 MED ORDER — ENOXAPARIN SODIUM 40 MG/0.4ML IJ SOSY: 40.0000 mg | PREFILLED_SYRINGE | INTRAMUSCULAR | Status: DC

## 2021-05-25 MED ORDER — STROKE: EARLY STAGES OF RECOVERY BOOK
Freq: Once | Status: DC
Start: 2021-05-25 — End: 2021-05-26

## 2021-05-25 MED ORDER — CLOPIDOGREL BISULFATE 75 MG PO TABS
75.0000 mg | ORAL_TABLET | Freq: Every day | ORAL | Status: DC
Start: 2021-05-26 — End: 2021-05-26
  Administered 2021-05-26: 75 mg via ORAL
  Filled 2021-05-25: qty 1

## 2021-05-25 MED ORDER — CLOPIDOGREL BISULFATE 75 MG PO TABS
300.0000 mg | ORAL_TABLET | Freq: Once | ORAL | Status: AC
  Administered 2021-05-25: 300 mg via ORAL
  Filled 2021-05-25: qty 4

## 2021-05-25 MED ORDER — ASPIRIN EC 81 MG PO TBEC
81.0000 mg | DELAYED_RELEASE_TABLET | Freq: Every day | ORAL | Status: DC
  Administered 2021-05-25 – 2021-05-26 (×2): 81 mg via ORAL
  Filled 2021-05-25 (×2): qty 1

## 2021-05-25 MED ORDER — NICOTINE 21 MG/24HR TD PT24
21.0000 mg | MEDICATED_PATCH | Freq: Every day | TRANSDERMAL | Status: DC
  Administered 2021-05-25 – 2021-05-26 (×2): 21 mg via TRANSDERMAL
  Filled 2021-05-25 (×2): qty 1

## 2021-05-25 MED ORDER — ACETAMINOPHEN 650 MG RE SUPP: 650.0000 mg | RECTAL | Status: DC | PRN

## 2021-05-25 MED ORDER — ACETAMINOPHEN 325 MG PO TABS: 650.0000 mg | ORAL_TABLET | ORAL | Status: DC | PRN

## 2021-05-25 MED ORDER — ACETAMINOPHEN 160 MG/5ML PO SOLN
650.0000 mg | ORAL | Status: DC | PRN
  Filled 2021-05-25: qty 20.3

## 2021-05-25 NOTE — ED Notes (Signed)
Visitor remains at bedside. Pt resting calmly on stretcher; denies any needs currently.  ?

## 2021-05-25 NOTE — ED Notes (Signed)
Pt labs drawn and sent, Pt and wife give verbal consent to DC. Pt going from here to [facility].  ?

## 2021-05-25 NOTE — Consult Note (Addendum)
Neurology Consultation ?Reason for Consult: CRAO ?Requesting Physician: Marlin Canary ? ?CC: Vision loss inferior fields left eye  ? ?History is obtained from: Patient and chart review  ? ?HPI: Martin Cook is a 61 y.o. male with a past medical history significant for hypertension, hyperlipidemia, ongoing tobacco abuse, prediabetes, obesity (BMI 29.26), aortic atherosclerosis, ? ?He reports that he woke up on Saturday with vision loss in the left eye, was told by ophthalmology yesterday that he had had a stroke of the eye and otherwise denies any other issues recently on full review of systems.  He reports he has been tolerating the nicotine patch well, but is precontemplative to contemplative about quitting smoking ? ? ?LKW: Friday night  ?tPA given?: No, out of the window on arrival ?IA performed?: No, exam on arrival not consistent with LVO ?Premorbid modified rankin scale:  ?    0 - No symptoms. ? ?ROS: All other review of systems was negative except as noted in the HPI.  ? ?Past Medical History:  ?Diagnosis Date  ? GERD (gastroesophageal reflux disease)   ? Hyperlipidemia   ? Hypertension   ? ?History reviewed. No pertinent surgical history. ? ?Current Outpatient Medications  ?Medication Instructions  ? amLODipine (NORVASC) 5 mg, Oral, Daily  ? aspirin EC 81 mg, Oral, Daily  ? famotidine (PEPCID) 20-40 mg, Oral, Daily PRN  ? rosuvastatin (CRESTOR) 40 mg, Oral, Daily  ? ? ? ?Family History  ?Problem Relation Age of Onset  ? Stroke Mother   ? Hyperlipidemia Mother   ? Stroke Father   ? Deep vein thrombosis Father   ? Hyperlipidemia Father   ? Hyperlipidemia Sister   ? Hyperlipidemia Brother   ? Cancer Brother 55  ?     bone  ? Cancer Brother 28  ?     liver  ? Hyperlipidemia Brother   ? Stroke Maternal Grandfather   ?     Pt is unsure   ? ?Current Outpatient Medications  ?Medication Instructions  ? amLODipine (NORVASC) 5 mg, Oral, Daily  ? aspirin EC 81 mg, Oral, Daily  ? famotidine (PEPCID) 20-40 mg, Oral,  Daily PRN  ? rosuvastatin (CRESTOR) 40 mg, Oral, Daily  ? ? ? ?Social History:  reports that he has been smoking cigarettes. He has a 43.00 pack-year smoking history. He has never used smokeless tobacco. He reports that he does not drink alcohol and does not use drugs. He drinks a few drinks on the weekend (4-6 /night and sometimes 2-3 during the week; family reports he has cut back.  ? ?Exam: ?Current vital signs: ?BP (!) 165/96 (BP Location: Left Arm)   Pulse 79   Temp 98.2 ?F (36.8 ?C) (Oral)   Resp 18   Ht 5\' 7"  (1.702 m)   Wt 90 kg   SpO2 93%   BMI 31.08 kg/m?  ?Vital signs in last 24 hours: ?Temp:  [97.7 ?F (36.5 ?C)-98.2 ?F (36.8 ?C)] 98.2 ?F (36.8 ?C) (05/08 1526) ?Pulse Rate:  [65-84] 79 (05/08 1526) ?Resp:  [14-19] 18 (05/08 1526) ?BP: (157-171)/(82-98) 165/96 (05/08 1526) ?SpO2:  [91 %-98 %] 93 % (05/08 1526) ?Weight:  [90 kg] 90 kg (05/08 1526) ? ? ?Physical Exam  ?Constitutional: Appears well-developed and well-nourished.  ?Psych: Affect appropriate to situation, calm and cooperative ?Eyes: No scleral injection ?HENT: No oropharyngeal obstruction.  Slightly poor dentition ?MSK: no joint deformities.  ?Cardiovascular: Normal rate and regular rhythm. Perfusing extremities well ?Respiratory: Effort normal, non-labored breathing ?GI: Soft.  No distension. There is no tenderness.  ?Skin: Warm dry and intact visible skin ? ?Neuro: ?Mental Status: ?Patient is awake, alert, oriented to person, place, month, year, and situation. ?Patient is able to give a clear and coherent history. ?No signs of aphasia or neglect ?Cranial Nerves: ?II: Visual Fields are full in the right eye, on the left eye he has loss of both right and left inferior visual fields. Pupils are round, and reactive to light, but there is a left eye afferent pupillary defect.   ?III,IV, VI: EOMI without ptosis or diploplia.  ?V: Facial sensation is symmetric to light touch ?VII: Facial movement is symmetric.  ?VIII: hearing is intact to  voice ?X: Uvula elevates symmetrically ?XI: Shoulder shrug is symmetric. ?XII: tongue is midline without atrophy or fasciculations.  ?Motor: ?Tone is normal. Bulk is normal. 5/5 strength was present in all four extremities.  ?Sensory: ?Sensation is symmetric to light touch and temperature in the arms and legs. ?Deep Tendon Reflexes: ?2+ and symmetric in the brachioradialis and patellae.  ?Cerebellar: ?FNF and HKS are intact bilaterally ?Gait:  ?Casual gait is normal ? ?NIHSS total 1 ?Score breakdown: One-point for left eye vision loss ?Performed at 9:45 AM 5/9 ? ? ?I have reviewed labs in epic and the results pertinent to this consultation are: ? ?Basic Metabolic Panel: ?Recent Labs  ?Lab 05/25/21 ?0935  ?NA 139  ?K 3.7  ?CL 102  ?CO2 26  ?GLUCOSE 132*  ?BUN 13  ?CREATININE 0.85  ?CALCIUM 9.6  ? ? ?CBC: ?Recent Labs  ?Lab 05/25/21 ?0935  ?WBC 7.7  ?NEUTROABS 5.6  ?HGB 17.2*  ?HCT 52.5*  ?MCV 94.4  ?PLT 202  ? ? ?Coagulation Studies: ?Recent Labs  ?  05/25/21 ?1318  ?LABPROT 12.7  ?INR 1.0  ?  ?Lab Results  ?Component Value Date  ? HGBA1C 5.9 (H) 05/25/2021  ? ?Lab Results  ?Component Value Date  ? CHOL 152 05/26/2021  ? HDL 55 05/26/2021  ? LDLCALC 60 05/26/2021  ? TRIG 183 (H) 05/26/2021  ? CHOLHDL 2.8 05/26/2021  ? ? ? ?I have reviewed the images obtained: ? ?MRI brain personally reviewed, agree with radiology: ?No acute intracranial process. No evidence of acute or subacute ?infarct. ? ?CTA head and neck personally reviewed, agree with radiology --additionally reviewed personally with the patient and family at bedside ?CTA neck: ?1. Asymmetrically diminished enhancement of the cervical right ?vertebral artery in the setting of downstream intracranial ?occlusion. Atherosclerotic disease within both vessels. Most ?notably, there are sites of severe stenosis within the V1 and V2 ?right vertebral artery. ?2. The common carotid and internal carotid arteries are patent ?within the neck. Atherosclerotic plaque,  bilaterally. Up to 40-50% ?stenosis of the proximal right ICA. No hemodynamically significant ?stenosis within the left CCA or ICA within the neck. ?3. Aortic Atherosclerosis (ICD10-I70.0) and Emphysema (ICD10-J43.9). ?  ?CTA head: ?1. The non-dominant right vertebral artery becomes occluded ?immediately beyond the right PICA origin. ?2. Additional intracranial atherosclerotic disease, as described. No ?other large vessel occlusion or proximal high-grade arterial ?stenosis is identified. ? ?Impression: This is a 61 year old gentleman with multiple small vessel risk factors as detailed above.  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  Most likely the etiology of his stroke is still atheroembolic, given he has no cardiac symptoms it is reasonable for his  outpatient primary care physician to follow-up his echocardiogram, and dual antiplatelet therapy is the most appropriate treatment. ? ?Preliminary Recommendations: ? ?# CRAO with left eye vision loss ?- MRI brain  ?- CTA head and neck to complete workup ?- Telemetry without evidence of atrial fibrillation ?- If patient confirms no recent bleeding issues, 300 mg plavix loading dose followed by 75 mg daily for 21 day course  ?- ASA 81 mg daily lifelong ?- OT eval for vision issues, patient counseled to contact DMV for possible driving restrictions secondary to his visual impairment ?- HgbA1c, fasting lipid panel ?- LDL goal < 70, meeting goal continue home rosuvastatin 40  ?- A1c goal < 7%, meeting goal ?- Echocardiogram read to be followed up by primary care physician ?- Risk factor modification ?- Blood pressure goal  ? - Permissive hypertension to 220/120 overnight on my initial recs ? - Now okay to gradually normalize BP over the next week ? ?# Smoking: ?- 5/9 will  counsel patient on the importance of quitting smoking to reduce risk of stroke. Discussed that nicotine withdrawal symptoms peak in the first three days of smoking cessation and subside over the next three to four weeks. [place]

## 2021-05-25 NOTE — ED Notes (Signed)
Pt denies any resp conditions; reports smokes every day. Pt 93% RA. Resp reg/unlabored currently. Skin dry. Denies HA and CP.  ?

## 2021-05-25 NOTE — ED Notes (Signed)
Pt states vision is still 'fuzzy' from pupil dilation at approx 1330 this afternoon.  Pupils still dilated upon assessment. ?

## 2021-05-25 NOTE — Assessment & Plan Note (Signed)
-  place patch ?-encourage cessation ?-risk factor for CVA ?

## 2021-05-25 NOTE — Assessment & Plan Note (Addendum)
-  seen on exam by opthamology ?-MRI brainnegative ?-CTA done ?-discussed with neuro: ASA/plavix x 3 weeks then ASA alone ?-echo read pending - PCP to follow up ?-FLP<70/HgA1c <7 ?-sed rate normal ?

## 2021-05-25 NOTE — Telephone Encounter (Signed)
? ? ?  Chief Complaint: Partial loss of vision left eye since Saturday. "Half my vision is grey." BP Saturday 203/101. Today BP 172/96. ?Symptoms: Above ?Frequency: Saturday ?Pertinent Negatives: Patient denies chest pain or headache. "I'm fine." ?Disposition: [x] ED /[] Urgent Care (no appt availability in office) / [] Appointment(In office/virtual)/ []  [place] [facility]/ [] Home Care/ [] Refused Recommended Disposition /[] [place] [facility]/ []  Follow-up with PCP ?Additional Notes: Refuses to call 911. "I'll have my wife take me."  ?Reason for Disposition ? Sounds like a life-threatening emergency to the triager ? ?Answer Assessment - Initial Assessment Questions ?1. SYMPTOM: "What is the main symptom you are concerned about?" (e.g., weakness, numbness) ?    Left eye - vision loss ?2. ONSET: "When did this start?" (minutes, hours, days; while sleeping) ?    Saturday ?3. LAST NORMAL: "When was the last time you (the patient) were normal (no symptoms)?" ?    Last week ?4. PATTERN "Does this come and go, or has it been constant since it started?"  "Is it present now?" ?    Constant ?5. CARDIAC SYMPTOMS: "Have you had any of the following symptoms: chest pain, difficulty breathing, palpitations?" ?    No ?6. NEUROLOGIC SYMPTOMS: "Have you had any of the following symptoms: headache, dizziness, vision loss, double vision, changes in speech, unsteady on your feet?" ?    Loss of vision left eye ?7. OTHER SYMPTOMS: "Do you have any other symptoms?" ?    High BP Saturday 203/101    ?8. PREGNANCY: "Is there any chance you are pregnant?" "When was your last menstrual period?" ?    N/a ? ?Protocols used: Neurologic Deficit-A-AH ? ?

## 2021-05-25 NOTE — ED Triage Notes (Addendum)
To ED from home due to awakening Saturday at [place] with blurry vision to bottom half of left eye. States that his BP has been running very high since Saturday as well. No injury or pain to eye. No weakness or confusion. No balance issues ? ?LNW Friday 5/5 at 11PM when he went to bed. Sx discovered 5/6 9AM ?

## 2021-05-25 NOTE — H&P (Addendum)
?History and Physical  ? ? ?Patient: Martin Cook [id number] DOB: Sep 19, 1960 ?DOA: 05/25/2021 ?DOS: the patient was seen and examined on 05/25/2021 ?PCP: Teodora Medici, DO  ?Patient coming from: Home ? ?Chief Complaint: partial vision loss in left eye 5/6 ? ?HPI: Martin Cook is a 61 y.o. male with medical history significant of HTN, HLD and tobacco abuse who presented to the ER on 5/8 stating he lost vision in the bottom half of his left eye on 5/6.  He was initially seen in the ER in the AM and sent to opthalmology for a fundoscopy evaluation.  Verbal reports from that visit say retinal artery occlusion and patient was told to come back to the hospital for CVA work up. ? ?Patient also says his BP has been higher than normal since Saturday as well.  He is also very anxious about being in the hospital.  He is a "heavy" smoker as well with occasional alcohol use.  ? ?No issues with bleeding, no palpitations and otherwise has been in his normal health. ? ? ?Review of Systems: As mentioned in the history of present illness. All other systems reviewed and are negative. ?Past Medical History:  ?Diagnosis Date  ? GERD (gastroesophageal reflux disease)   ? Hyperlipidemia   ? Hypertension   ? ?No past surgical history on file. ?Social History:  reports that he has been smoking cigarettes. He has a 43.00 pack-year smoking history. He has never used smokeless tobacco. He reports that he does not drink alcohol and does not use drugs. ? ?No Known Allergies ? ?Family History  ?Problem Relation Age of Onset  ? Stroke Mother   ? Hyperlipidemia Mother   ? Stroke Father   ? Deep vein thrombosis Father   ? Hyperlipidemia Father   ? Hyperlipidemia Sister   ? Hyperlipidemia Brother   ? Cancer Brother 61  ?     bone  ? Cancer Brother 25  ?     liver  ? Hyperlipidemia Brother   ? Stroke Maternal Grandfather   ?     Pt is unsure   ? ? ?Prior to Admission medications   ?Medication Sig Start Date End Date Taking? Authorizing  Provider  ?amLODipine (NORVASC) 5 MG tablet Take 1 tablet (5 mg total) by mouth daily. 03/11/21   Teodora Medici, DO  ?aspirin EC 81 MG tablet Take 81 mg by mouth daily.    [provider]  ?famotidine (PEPCID) 20 MG tablet Take 1-2 tablets (20-40 mg total) by mouth daily as needed for heartburn or indigestion. 07/25/20   Delsa Grana, PA-C  ?rosuvastatin (CRESTOR) 40 MG tablet Take 1 tablet (40 mg total) by mouth daily. 07/25/20   Delsa Grana, PA-C  ? ? ?Physical Exam: ?Vitals:  ? 05/25/21 1526  ?BP: (!) 165/96  ?Pulse: 79  ?Resp: 18  ?Temp: 98.2 ?F (36.8 ?C)  ?TempSrc: Oral  ?SpO2: 93%  ?Weight: 90 kg  ?Height: 5\' 7"  (1.702 m)  ? ?Physical Exam ?Constitutional:   ?   Appearance: Normal appearance. He is obese.  ?HENT:  ?   Head: Normocephalic.  ?Eyes:  ?   General: Visual field deficit present.  ?Cardiovascular:  ?   Rate and Rhythm: Normal rate and regular rhythm.  ?Pulmonary:  ?   Effort: Pulmonary effort is normal.  ?   Breath sounds: Normal breath sounds.  ?Abdominal:  ?   Palpations: Abdomen is soft.  ?Musculoskeletal:     ?   General: Normal range  of motion.  ?Skin: ?   General: Skin is warm and dry.  ?Neurological:  ?   Mental Status: He is alert and oriented to person, place, and time. Mental status is at baseline.  ?Psychiatric:     ?   Mood and Affect: Mood is anxious.  ?  ? ? ?Data Reviewed: ? ?EKG: NSR ?Labs unremarkable ?CT head: No evidence of acute intracranial abnormality. Does show mild mucosal thickening within the bilateral frontal and ethmoid sinuses ? ?Assessment and Plan: ?* Central retinal artery occlusion ?-seen on exam by opthamology ?-neurology to see in the AM ?-MRI brain ?-CTA ordered ?-discussed with neuro: plavix load followed by daily plavix 75 mg ?-echo ?-FLP/HgA1c ?-do not think he needs PT/OT/SLP so I will not order ?-sed rate normal ? ?Abnormal hemoglobin (HCC) ?-? Related to smoking ? ?Obesity (BMI 30-39.9) ?Estimated body mass index is 31.08 kg/m? as calculated from  the following: ?  Height as of this encounter: 5\' 7"  (1.702 m). ?  Weight as of this encounter: 90 kg. ? ?Alcohol use ?-only occasional ? ?Mixed hyperlipidemia ?-on statin ?-FLP in AM ? ?Tobacco abuse ?-place patch ?-encourage cessation ?-risk factor for CVA ? ?Hypertension ?-resume home meds ?-episode > 48 hours ago ? ? ? ? ? Advance Care Planning:   Code Status: Full Code full ? ?Consults: neurology- Dr. Curly Shores ? ?Family Communication: at bedside (wife) ? ?Severity of Illness: ?The appropriate patient status for this patient is INPATIENT. Inpatient status is judged to be reasonable and necessary in order to provide the required intensity of service to ensure the patient's safety. The patient's presenting symptoms, physical exam findings, and initial radiographic and laboratory data in the context of their chronic comorbidities is felt to place them at high risk for further clinical deterioration. Furthermore, it is not anticipated that the patient will be medically stable for discharge from the hospital within 2 midnights of admission.  ? ?* I certify that at the point of admission it is my clinical judgment that the patient will require inpatient hospital care spanning beyond 2 midnights from the point of admission due to high intensity of service, high risk for further deterioration and high frequency of surveillance required.* ? ?Author: ?Geradine Girt, DO ?05/25/2021 4:14 PM ? ?For on call review [email or web address].  ?

## 2021-05-25 NOTE — ED Triage Notes (Signed)
Pt here from opthalmology office with a possible stroke like symptoms related to hie left eye. Pt bp elevated as well. Pt denies pain in the left eye. ?

## 2021-05-25 NOTE — Assessment & Plan Note (Signed)
Estimated body mass index is 31.08 kg/m? as calculated from the following: ?  Height as of this encounter: 5\' 7"  (1.702 m). ?  Weight as of this encounter: 90 kg. ?

## 2021-05-25 NOTE — Assessment & Plan Note (Signed)
-  only occasional ?

## 2021-05-25 NOTE — ED Notes (Signed)
Pt states he woke up Saturday with inability to see from chin down with left eye. States he has good peripheral vision to right and left side. Pt states symptoms have been persistent, no hx of the same. States no other deficits or problems swallowing.  ?

## 2021-05-25 NOTE — ED Provider Notes (Signed)
? ?[facility] ?Provider Note ? ? ? None  ?  (approximate) ? ? ?History  ? ?Chief complaint is lost vision in left eye ? ? ?HPI ? ?Martin Cook is a 61 y.o. male who was seen earlier today and sent to the eye doctor.  The eye doctor visualized an embolus in the central retinal artery.  This is what caused the patient to lose the vision in the lower visual field of his left eye.  Dr. Estelle Grumbles the ophthalmologist wants him to come back here for admission for stroke work-up. ? ?  ? ? ?Physical Exam  ? ?Triage Vital Signs: ?ED Triage Vitals  ?Enc Vitals Group  ?   BP   ?   Pulse   ?   Resp   ?   Temp   ?   Temp src   ?   SpO2   ?   Weight   ?   Height   ?   Head Circumference   ?   Peak Flow   ?   Pain Score   ?   Pain Loc   ?   Pain Edu?   ?   Excl. in GC?   ? ? ?Most recent vital signs: ?Vitals:  ? 05/25/21 1526  ?BP: (!) 165/96  ?Pulse: 79  ?Resp: 18  ?Temp: 98.2 ?F (36.8 ?C)  ?SpO2: 93%  ? ? ? ?General: Awake, no distress.  ?CV:  Good peripheral perfusion.  ?Resp:  Normal effort.  ?Abd:  No distention.  ? ? ? ?ED Results / Procedures / Treatments  ? ?Labs ?(all labs ordered are listed, but only abnormal results are displayed) ?Labs Reviewed  ?CBC  ?CREATININE, SERUM  ?HEMOGLOBIN A1C  ? ? ? ?EKG ? ? ? ?RADIOLOGY ? ? ? ?PROCEDURES: ? ?Critical Care performed:  ? ?Procedures ? ? ?MEDICATIONS ORDERED IN ED: ?Medications  ? stroke: early stages of recovery book (0 each Does not apply Hold 05/25/21 1617)  ?acetaminophen (TYLENOL) tablet 650 mg (has no administration in time range)  ?  Or  ?acetaminophen (TYLENOL) 160 MG/5ML solution 650 mg (has no administration in time range)  ?  Or  ?acetaminophen (TYLENOL) suppository 650 mg (has no administration in time range)  ?senna-docusate (Senokot-S) tablet 1 tablet (has no administration in time range)  ?enoxaparin (LOVENOX) injection 40 mg (has no administration in time range)  ?nicotine (NICODERM CQ - dosed in mg/24 hours) patch 21 mg (has no  administration in time range)  ?LORazepam (ATIVAN) tablet 1 mg (has no administration in time range)  ?amLODipine (NORVASC) tablet 5 mg (5 mg Oral Given 05/25/21 1606)  ?aspirin EC tablet 81 mg (81 mg Oral Given 05/25/21 1606)  ?famotidine (PEPCID) tablet 20 mg (has no administration in time range)  ?rosuvastatin (CRESTOR) tablet 40 mg (has no administration in time range)  ?clopidogrel (PLAVIX) tablet 75 mg (has no administration in time range)  ?clopidogrel (PLAVIX) tablet 300 mg (300 mg Oral Given 05/25/21 1605)  ?iohexol (OMNIPAQUE) 350 MG/ML injection 75 mL (75 mLs Intravenous Contrast Given 05/25/21 1609)  ? ? ? ?IMPRESSION / MDM / ASSESSMENT AND PLAN / ED COURSE  ?I reviewed the triage vital signs and the nursing notes. ?Central retinal artery occlusion from an embolus ? ?Patient will need a stroke work-up. ?  ? ? ?FINAL CLINICAL IMPRESSION(S) / ED DIAGNOSES  ? ?Final diagnoses:  ?Central retinal artery occlusion of left eye  ? ? ? ?Rx / DC Orders  ? ?  ED Discharge Orders   ? ? None  ? ?  ? ? ? ?Note:  This document was prepared using Dragon voice recognition software and may include unintentional dictation errors. ?  ?Arnaldo Natal, MD ?05/25/21 1638 ? ?

## 2021-05-25 NOTE — Assessment & Plan Note (Addendum)
on statin. 

## 2021-05-25 NOTE — Assessment & Plan Note (Addendum)
-?   Related to smoking ?-outpatient follow up ?

## 2021-05-25 NOTE — ED Notes (Signed)
Green and purple tubes sent to lab. 

## 2021-05-25 NOTE — Discharge Instructions (Signed)
Please see Dr. Wallace Going the eye doctor in his office.  Leave now and go directly there.  He needs to look in your eye.  They have better equipment there than we do here.  If there is any thing else going on he will send you back here if need be for admission. ?

## 2021-05-25 NOTE — ED Provider Notes (Signed)
? ?[facility] ?Provider Note ? ? ? Event Date/Time  ? First MD Initiated Contact with Patient 05/25/21 0932   ?  (approximate) ? ? ?History  ? ?Eye Problem ? ? ?HPI ? ?PARNELL SPIELER is a 61 y.o. male who comes in saying that on Saturday 2 days ago he woke up in the morning and could not see out of the bottom half of his left eye.  When I walked up to him he can see everything with the right eye but when he closes his right eye and leaves the left eye open he can only see the top half of me when I am standing in front of him.  He has no other medical problems.  He smokes.  He has never had this happen before. ? ?  ? ? ?Physical Exam  ? ?Triage Vital Signs: ?ED Triage Vitals [05/25/21 0920]  ?Enc Vitals Group  ?   BP (!) 158/92  ?   Pulse Rate 84  ?   Resp 16  ?   Temp 97.7 ?F (36.5 ?C)  ?   Temp Source Oral  ?   SpO2 96 %  ?   Weight 198 lb 6.6 oz (90 kg)  ?   Height 5\' 7"  (1.702 m)  ?   Head Circumference   ?   Peak Flow   ?   Pain Score 0  ?   Pain Loc   ?   Pain Edu?   ?   Excl. in GC?   ? ? ?Most recent vital signs: ?Vitals:  ? 05/25/21 1130 05/25/21 1300  ?BP:  (!) 171/84  ?Pulse: 65 76  ?Resp: 18 19  ?Temp:  98 ?F (36.7 ?C)  ?SpO2: 91% 92%  ? ? ? ?General: Awake, no distress.  ?Head normocephalic atraumatic ?Eyes pupils equal round reactive extraocular movements intact fundi appear normal ?CV:  Good peripheral perfusion.  Heart regular rate and rhythm no audible murmur ?Resp:  Normal effort.  Lungs are clear ?Abd:  No distention.  Soft nontender ?Extremities with no edema ?Neuro exam cranial nerves II through XII intact although visual fields except for as noted above were not checked cerebellar finger-nose and rapid alternating movements in the hands are normal motor strength is 5/5 throughout patient does not report any numbness ? ? ?ED Results / Procedures / Treatments  ? ?Labs ?(all labs ordered are listed, but only abnormal results are displayed) ?Labs Reviewed  ?COMPREHENSIVE  METABOLIC PANEL - Abnormal; Notable for the following components:  ?    Result Value  ? Glucose, Bld 132 (*)   ? All other components within normal limits  ?CBC WITH DIFFERENTIAL/PLATELET - Abnormal; Notable for the following components:  ? Hemoglobin 17.2 (*)   ? HCT 52.5 (*)   ? All other components within normal limits  ?PROTIME-INR  ?APTT  ?C-REACTIVE PROTEIN  ?SEDIMENTATION RATE  ?URINALYSIS, ROUTINE W REFLEX MICROSCOPIC  ?DIFFERENTIAL  ? ? ? ?EKG ? ?EKG read and interpreted by me shows normal sinus rhythm rate of 79 normal axis no acute ST-T wave changes slightly decreased R wave progression in the precordial leads ? ? ?RADIOLOGY ?CT of the head read by radiology reviewed by me shows no acute disease ? ?PROCEDURES: ? ?Critical Care performed:  ? ?Procedures ? ? ?MEDICATIONS ORDERED IN ED: ?Medications - No data to display ? ? ?IMPRESSION / MDM / ASSESSMENT AND PLAN / ED COURSE  ?I reviewed the triage vital signs and  the nursing notes. ?I discussed the patient in detail with Dr.Brasington, ophthalmology.  He asked me to order a sed rate and CRP and send the patient to him so he can evaluate the patient there in the office with his better equipment.  He will return the patient to me if its anything that could be consistent with a stroke.  Please note the symptoms are over 48 hours old so this is not a code stroke. ? ?Ophthalmology is worried about an inflammatory cause for his loss of vision like temporal arteritis or something similar.  Patient has lost his vision in the lower half of the left eye only. ? ?The patient is on the cardiac monitor to evaluate for evidence of arrhythmia and/or significant heart rate changes.  None were seen ? ? ? ?FINAL CLINICAL IMPRESSION(S) / ED DIAGNOSES  ? ?Final diagnoses:  ?Loss of vision  ? ? ? ?Rx / DC Orders  ? ?ED Discharge Orders   ? ?      Ordered  ?  Nursing communication       ?Comments: Lead needs a blue top tube and a serum separator tube before he goes.  ? 05/25/21  1304  ? ?  ?  ? ?  ? ? ? ?Note:  This document was prepared using Dragon voice recognition software and may include unintentional dictation errors. ?  ?Arnaldo Natal, MD ?05/25/21 1637 ? ?

## 2021-05-25 NOTE — Assessment & Plan Note (Addendum)
--   resume home meds 

## 2021-05-26 ENCOUNTER — Ambulatory Visit: Payer: BLUE CROSS/BLUE SHIELD | Admitting: Internal Medicine

## 2021-05-26 ENCOUNTER — Inpatient Hospital Stay
Admit: 2021-05-26 | Discharge: 2021-05-26 | Disposition: A | Discharge status: Home / Self Care | Payer: BLUE CROSS/BLUE SHIELD | Attending: Internal Medicine | Admitting: Internal Medicine

## 2021-05-26 LAB — ECHOCARDIOGRAM COMPLETE
AR max vel: 2.54 cm2
AV Area VTI: 2.77 cm2
AV Area mean vel: 2.36 cm2
AV Mean grad: 3 mmHg
AV Peak grad: 4.3 mmHg
Ao pk vel: 1.04 m/s
Area-P 1/2: 2.65 cm2
Height: 68 in
MV VTI: 2.71 cm2
S' Lateral: 2.6 cm
Weight: 3079.39 oz

## 2021-05-26 LAB — LIPID PANEL
Cholesterol: 152 mg/dL (ref 0–200)
HDL: 55 mg/dL (ref >40)
LDL Cholesterol: 60 mg/dL (ref 0–99)
Total CHOL/HDL Ratio: 2.8 RATIO
Triglycerides: 183 mg/dL — ABNORMAL HIGH (ref <150)
VLDL: 37 mg/dL (ref 0–40)

## 2021-05-26 LAB — HEMOGLOBIN A1C
Hgb A1c MFr Bld: 5.9 % — ABNORMAL HIGH (ref 4.8–5.6)
Mean Plasma Glucose: 122.63 mg/dL

## 2021-05-26 MED ORDER — CLOPIDOGREL BISULFATE 75 MG PO TABS
75.0000 mg | ORAL_TABLET | Freq: Every day | ORAL | 0 refills | Status: AC
Start: 2021-05-27 — End: 2021-06-17

## 2021-05-26 MED ORDER — NICOTINE 21 MG/24HR TD PT24: 21.0000 mg | MEDICATED_PATCH | Freq: Every day | TRANSDERMAL | 0 refills | Status: DC

## 2021-05-26 NOTE — Discharge Summary (Signed)
?Physician Discharge Summary ?  ?Patient: Martin Cook MRN: [id number] DOB: 05/14/1960  ?Admit date:     05/25/2021  ?Discharge date: 05/26/21  ?Discharge Physician: Joseph Art  ? ?PCP: Margarita Mail, DO  ? ?Recommendations at discharge:  ? ? Echo read pending (patient did not want to stay for results) ?ASA/plavix x 3 weeks then ASA alone ?Consider outpatient OT eval (patient did not want to stay for in hospital) ? ? ?Discharge Diagnoses: ?Principal Problem: ?  Central retinal artery occlusion ?Active Problems: ?  Hypertension ?  Tobacco abuse ?  Mixed hyperlipidemia ?  Alcohol use ?  Obesity (BMI 30-39.9) ?  Abnormal hemoglobin (HCC) ? ? ? ?Hospital Course: ?Martin Cook is a 61 y.o. male with medical history significant of HTN, HLD and tobacco abuse who presented to the ER on 5/8 stating he lost vision in the bottom half of his left eye on 5/6.  He was initially seen in the ER in the AM and sent to opthalmology for a fundoscopy evaluation.  Verbal reports from that visit say retinal artery occlusion and patient was told to come back to the hospital for CVA work up. ?  ?Patient also says his BP has been higher than normal since Saturday as well.  He is also very anxious about being in the hospital.  He is a "heavy" smoker as well with occasional alcohol use.  ?  ?No issues with bleeding, no palpitations and otherwise has been in his normal health ? ?Assessment and Plan: ?* Central retinal artery occlusion ?-seen on exam by opthamology ?-MRI brainnegative ?-CTA done ?-discussed with neuro: ASA/plavix x 3 weeks then ASA alone ?-echo read pending - PCP to follow up ?-FLP<70/HgA1c <7 ?-sed rate normal ? ?Abnormal hemoglobin (HCC) ?-? Related to smoking ?-outpatient follow up ? ?Obesity (BMI 30-39.9) ?Estimated body mass index is 31.08 kg/m? as calculated from the following: ?  Height as of this encounter: 5\' 7"  (1.702 m). ?  Weight as of this encounter: 90 kg. ? ?Alcohol use ?-only occasional ? ?Mixed  hyperlipidemia ?-on statin ? ? ?Tobacco abuse ?-place patch ?-encourage cessation ?-risk factor for CVA ? ?Hypertension ?-resume home meds ? ? ? ? ? ?Consultants: neuro ? ?Disposition: Home ?Diet recommendation:  ?Discharge Diet Orders (From admission, onward)  ? ?  Start     Ordered  ? 05/26/21 0000  Diet - low sodium heart healthy       ? 05/26/21 1118  ? ?  ?  ? ?  ? ?Cardiac diet ?DISCHARGE MEDICATION: ?Allergies as of 05/26/2021   ?No Known Allergies ?  ? ?  ?Medication List  ?  ? ?TAKE these medications   ? ?amLODipine 5 MG tablet ?Commonly known as: NORVASC ?Take 1 tablet (5 mg total) by mouth daily. ?  ?aspirin EC 81 MG tablet ?Take 81 mg by mouth daily. ?  ?clopidogrel 75 MG tablet ?Commonly known as: PLAVIX ?Take 1 tablet (75 mg total) by mouth daily for 21 days. ?Start taking on: May 27, 2021 ?  ?famotidine 20 MG tablet ?Commonly known as: PEPCID ?Take 1-2 tablets (20-40 mg total) by mouth daily as needed for heartburn or indigestion. ?  ?nicotine 21 mg/24hr patch ?Commonly known as: NICODERM CQ - dosed in mg/24 hours ?Place 1 patch (21 mg total) onto the skin daily. ?Start taking on: May 27, 2021 ?  ?rosuvastatin 40 MG tablet ?Commonly known as: CRESTOR ?Take 1 tablet (40 mg total) by mouth daily. ?  ? ?  ? ?  Follow-up Information   ? ? Margarita Mail, DO Follow up in 1 week(s).   ?Specialty: Internal Medicine ?Why: echo results pending upon d/c ?Contact information: ?[address] ?[address] ?[place] [address] ?[phone] ? ? ?  ?  ? ?  ?  ? ?  ? ?Discharge Exam: ?Filed Weights  ? 05/25/21 1526 05/25/21 2124  ?Weight: 90 kg 87.3 kg  ? ?\ ? ?Condition at discharge: good ? ?The results of significant diagnostics from this hospitalization (including imaging, microbiology, ancillary and laboratory) are listed below for reference.  ? ?Imaging Studies: ?CT ANGIO HEAD NECK W WO CM ? ?Result Date: 05/25/2021 ?CLINICAL DATA:  Provided history: Stroke/TIA, determine embolic source. EXAM: CT  ANGIOGRAPHY HEAD AND NECK TECHNIQUE: Multidetector CT imaging of the head and neck was performed using the standard protocol during bolus administration of intravenous contrast. Multiplanar CT image reconstructions and MIPs were obtained to evaluate the vascular anatomy. Carotid stenosis measurements (when applicable) are obtained utilizing NASCET criteria, using the distal internal carotid diameter as the denominator. RADIATION DOSE REDUCTION: This exam was performed according to the departmental dose-optimization program which includes automated exposure control, adjustment of the mA and/or kV according to patient size and/or use of iterative reconstruction technique. CONTRAST:  55mL OMNIPAQUE IOHEXOL 350 MG/ML SOLN COMPARISON:  Noncontrast head CT performed earlier today 05/25/2021 FINDINGS: CTA NECK FINDINGS Aortic arch: Standard aortic branching. Atherosclerotic plaque within the visualized aortic arch and proximal major branch vessels of the neck. Streak and beam hardening artifact arising from a dense left-sided contrast bolus partially obscures the left subclavian artery. Within this limitation, there is no appreciable hemodynamically significant innominate or proximal subclavian artery stenosis. Right carotid system: CCA and ICA patent within the neck. Atherosclerotic plaque throughout the CCA, about the carotid bifurcation and within the proximal to mid cervical ICA. Stenosis of the proximal ICA of up to 40-50%. Tortuosity of the cervical ICA. Left carotid system: CCA and ICA patent within the neck without hemodynamically significant stenosis (50% or greater). Mild to moderate atherosclerotic plaque within the distal CCA, about the carotid bifurcation and within the proximal ICA. Partially retropharyngeal course of cervical ICA. Vertebral arteries: Vertebral arteries patent within the neck. The left vertebral artery is dominant. Asymmetrically diminished enhancement of the right vertebral artery, likely  due to downstream occlusion. Significant atherosclerotic irregularity of atherosclerotic irregularity of the right vertebral artery throughout the neck. Most notably, there are sites of severe stenosis within the V1 and V2 segments. The dominant left vertebral artery is patent within the neck without stenosis. Scattered nonstenotic calcified plaque throughout this vessel. Skeleton: Cervical spondylosis, greatest at C5-C6. No appreciable high-grade spinal canal stenosis. Bilateral C5-C6 bony neural foraminal narrowing. Other neck: No neck mass or cervical lymphadenopathy. Upper chest: No consolidation within the imaged lung apices. Centrilobular emphysema. Review of the MIP images confirms the above findings CTA HEAD FINDINGS Anterior circulation: The intracranial internal carotid arteries are patent. Atherosclerotic plaque within both vessels with no more than mild stenosis. The M1 middle cerebral arteries are patent. Atherosclerotic irregularity of the M2 and more distal MCA vessels, bilaterally. No M2 proximal branch occlusion or high-grade proximal stenosis. The anterior cerebral arteries are patent. No intracranial aneurysm is identified. Posterior circulation: The non-dominant intracranial right vertebral artery becomes occluded immediately beyond the right PICA origin. Atherosclerotic irregularity of the intracranial right vertebral artery more proximally. The dominant intracranial left vertebral artery is patent. Mild non-stenotic calcified plaque within this vessel. The basilar artery is patent. The posterior cerebral  arteries are patent without proximal occlusion or high-grade proximal stenosis. A right posterior communicating artery is present. The left posterior communicating artery is diminutive or absent. Venous sinuses: Within the limitations of contrast timing, no convincing thrombus. Anatomic variants: As described. Review of the MIP images confirms the above findings CTA head impression #1 called by  telephone at the time of interpretation on 05/25/2021 at 4:46 pm to [facility] , who verbally acknowledged these results. IMPRESSION: CTA neck: 1. Asymmetrically diminished enhancement of the cervical right

## 2021-05-26 NOTE — Hospital Course (Signed)
Martin Cook is a 61 y.o. male with medical history significant of HTN, HLD and tobacco abuse who presented to the ER on 5/8 stating he lost vision in the bottom half of his left eye on 5/6.  He was initially seen in the ER in the AM and sent to opthalmology for a fundoscopy evaluation.  Verbal reports from that visit say retinal artery occlusion and patient was told to come back to the hospital for CVA work up. ?  ?Patient also says his BP has been higher than normal since Saturday as well.  He is also very anxious about being in the hospital.  He is a "heavy" smoker as well with occasional alcohol use.  ?  ?No issues with bleeding, no palpitations and otherwise has been in his normal health ?

## 2021-05-26 NOTE — Plan of Care (Signed)
?  Problem: Health Behavior/Discharge Planning: ?Goal: Ability to manage health-related needs will improve ?Outcome: Progressing ?  ?Problem: Clinical Measurements: ?Goal: Ability to maintain clinical measurements within normal limits will improve ?Outcome: Progressing ?Goal: Will remain free from infection ?Outcome: Progressing ?Goal: Diagnostic test results will improve ?Outcome: Progressing ?Goal: Respiratory complications will improve ?Outcome: Progressing ?Goal: Cardiovascular complication will be avoided ?Outcome: Progressing ?  ?Problem: Coping: ?Goal: Level of anxiety will decrease ?Outcome: Progressing ?  ?Problem: Elimination: ?Goal: Will not experience complications related to bowel motility ?Outcome: Progressing ?Goal: Will not experience complications related to urinary retention ?Outcome: Progressing ?  ?Problem: Safety: ?Goal: Ability to remain free from injury will improve ?Outcome: Progressing ?  ?

## 2021-06-04 ENCOUNTER — Other Ambulatory Visit: Payer: Self-pay

## 2021-06-04 ENCOUNTER — Encounter: Payer: Self-pay | Admitting: Internal Medicine

## 2021-06-04 ENCOUNTER — Ambulatory Visit: Payer: BLUE CROSS/BLUE SHIELD | Admitting: Internal Medicine

## 2021-06-04 VITALS — BP 128/72 | HR 76 | Temp 97.6°F | Resp 18 | Ht 67.0 in | Wt 198.7 lb

## 2021-06-04 DIAGNOSIS — H3412 Central retinal artery occlusion, left eye: Secondary | ICD-10-CM | POA: Diagnosis not present

## 2021-06-04 DIAGNOSIS — I10 Essential (primary) hypertension: Secondary | ICD-10-CM

## 2021-06-04 DIAGNOSIS — Z720 Tobacco use: Secondary | ICD-10-CM

## 2021-06-04 DIAGNOSIS — E782 Mixed hyperlipidemia: Secondary | ICD-10-CM

## 2021-06-04 DIAGNOSIS — R7303 Prediabetes: Secondary | ICD-10-CM

## 2021-06-04 DIAGNOSIS — Z09 Encounter for follow-up examination after completed treatment for conditions other than malignant neoplasm: Secondary | ICD-10-CM | POA: Diagnosis not present

## 2021-06-04 MED ORDER — AMLODIPINE BESYLATE 5 MG PO TABS: 5.0000 mg | ORAL_TABLET | Freq: Every day | ORAL | 0 refills | Status: DC

## 2021-06-04 NOTE — Patient Instructions (Signed)
It was great seeing you today!  Plan discussed at today's visit: -Continue Amlodipine 5 mg daily and checking blood pressure at home, if BP >200/100 with symptoms, please return to the ER -Continue Plavix for 3 weeks and then aspirin -Continue Crestor  -Continue nicotine patch    Follow up in: 1 month   Take care and let us know if you have any questions or concerns prior to your next visit.  Dr. Caralee Ates

## 2021-06-04 NOTE — Progress Notes (Signed)
Established Patient Office Visit  Subjective   Patient ID: Martin Cook, male    DOB: 04/17/1960  Age: 61 y.o. MRN: [id number]  Chief Complaint  Patient presents with   Hospitalization Follow-up    HPI Martin Cook is a 61 year old male here for hospital follow up.  Discharge Date: 05/26/21 Diagnosis: Central retinal artery occlusion Procedures/tests: MRI brain, CTA head and neck, TTE, results below  Consultants: Neurology, Ophthalmology  New medications: aspirin, Plavix  Discontinued medications: Nothing  Status: better  Central Retinal Artery Occlusion: -Presented with vision loss in the bottom half of his  left eye suddenly when he woke up on the morning of 05/30/21, seen on exam by Ophthalmology -MRI of the brain negative for acute intracranial process, without evidence of acute or subacute infarct  -CTA head and neck 05/25/21 - the non-dominant right vertberal artery becomes occluded immediately beyond the right PICA origin wihtout other large vessel occlusion. Asymmetrically diminished enhancement of the cervical right vertebral artery, common carotid and internal carotid arteries are patent, up to 40-50% stenosis in the proximal right ICA -Evaluated by Neuro who recommended starting on aspirin, Plavix x 3 weeks and then only aspirin -Echo results 05/26/21: EF 60-65% with normal function of the left ventricle, no valvular abnormalities  -Inflammatory markers negative, LDL 60, A1c 5.9 -Vision is starting to return, only about 10% of vision in right eye lost currently, returning more and more each   Abnormal Hgb: -History of heavy smoking at least 40 years, on nicotine patches now, trying to quit. Hasn't smoked since he was discharged from the hospital  -Hbg 12.7 on 05/25/21, 16.2 prior   Hypertension: -Medications: Amlodipine 5 mg, had been on 2.5 mg previously  -Patient is compliant with above medications and reports no side effects. -Checking BP at home (average): was 210/80  Saturday night but since then has been 130/199 139/82, 165/86, 128/70, 138/77 -Denies any SOB, CP, LE edema or symptoms of hypotension  HLD: -Medications: Crestor 40 -Patient is compliant with above medications and reports no side effects.  -Last lipid panel: Lipid Panel     Component Value Date/Time   CHOL 152 05/26/2021 0502   CHOL 185 06/09/2015 0854   TRIG 183 (H) 05/26/2021 0502   HDL 55 05/26/2021 0502   HDL 57 06/09/2015 0854   CHOLHDL 2.8 05/26/2021 0502   VLDL 37 05/26/2021 0502   LDLCALC 60 05/26/2021 0502   LDLCALC 81 01/28/2021 0920   LABVLDL 44 (H) 06/09/2015 0854   GERD: -Currently on Pepcid 20 mg daily, working on avoiding trigger foods   Pre-Diabetes: -A1c 05/25/21 5.9, had been 6.3% in January  -Not currently on medications     Review of Systems  Constitutional:  Negative for chills and fever.  Eyes:  Negative for blurred vision, pain, discharge and redness.  Respiratory:  Negative for cough and shortness of breath.   Cardiovascular:  Negative for chest pain.  Gastrointestinal:  Negative for abdominal pain, heartburn, nausea and vomiting.  Neurological:  Negative for dizziness, weakness and headaches.     Objective:     BP 128/72   Pulse 76   Temp 97.6 F (36.4 C) (Oral)   Resp 18   Ht 5\' 7"  (1.702 m)   Wt 198 lb 11.2 oz (90.1 kg)   SpO2 94%   BMI 31.12 kg/m  BP Readings from Last 3 Encounters:  06/04/21 128/72  05/26/21 (!) 150/84  05/25/21 (!) 171/84   Wt Readings from Last 3  Encounters:  06/04/21 198 lb 11.2 oz (90.1 kg)  05/25/21 192 lb 7.4 oz (87.3 kg)  05/25/21 198 lb 6.6 oz (90 kg)      Physical Exam Constitutional:      Appearance: Normal appearance.  HENT:     Head: Normocephalic and atraumatic.  Eyes:     Extraocular Movements: Extraocular movements intact.     Conjunctiva/sclera: Conjunctivae normal.     Pupils: Pupils are equal, round, and reactive to light.  Neck:     Vascular: No carotid bruit.  Cardiovascular:      Rate and Rhythm: Normal rate and regular rhythm.  Pulmonary:     Effort: Pulmonary effort is normal.     Breath sounds: Normal breath sounds.  Musculoskeletal:     Right lower leg: No edema.     Left lower leg: No edema.  Skin:    General: Skin is warm and dry.  Neurological:     General: No focal deficit present.     Mental Status: He is alert. Mental status is at baseline.  Psychiatric:        Mood and Affect: Mood normal.        Behavior: Behavior normal.     No results found for any visits on 06/04/21.  Last CBC Lab Results  Component Value Date   WBC 7.7 05/25/2021   HGB 17.2 (H) 05/25/2021   HCT 52.5 (H) 05/25/2021   MCV 94.4 05/25/2021   MCH 30.9 05/25/2021   RDW 12.9 05/25/2021   PLT 202 05/25/2021   Last metabolic panel Lab Results  Component Value Date   GLUCOSE 132 (H) 05/25/2021   NA 139 05/25/2021   K 3.7 05/25/2021   CL 102 05/25/2021   CO2 26 05/25/2021   BUN 13 05/25/2021   CREATININE 0.85 05/25/2021   GFRNONAA >60 05/25/2021   CALCIUM 9.6 05/25/2021   PROT 8.0 05/25/2021   ALBUMIN 4.6 05/25/2021   LABGLOB 2.1 06/09/2015   AGRATIO 2.2 06/09/2015   BILITOT 1.1 05/25/2021   ALKPHOS 67 05/25/2021   AST 33 05/25/2021   ALT 27 05/25/2021   ANIONGAP 11 05/25/2021   Last lipids Lab Results  Component Value Date   CHOL 152 05/26/2021   HDL 55 05/26/2021   LDLCALC 60 05/26/2021   TRIG 183 (H) 05/26/2021   CHOLHDL 2.8 05/26/2021   Last hemoglobin A1c Lab Results  Component Value Date   HGBA1C 5.9 (H) 05/25/2021   Last thyroid functions Lab Results  Component Value Date   TSH 1.50 02/16/2017   Last vitamin D Lab Results  Component Value Date   VD25OH 35 07/25/2020   Last vitamin B12 and Folate No results found for: VITAMINB12, FOLATE    The 10-year ASCVD risk score (Arnett DK, et al., 2019) is: 12.4%    Assessment & Plan:   1. Hospital discharge follow-up/Central retinal artery occlusion of left eye: All notes, imaging and  labs reviewed from recent hospitalization, discharge 05/26/21. No obvious reason for symptoms, MRI, echo unremarkable. CTA with 40-50% in the proximal right ICA. On Plavix for 21 days, tolerating well without abnormal bleeding. Continue aspirin. Will work on modifying risk factors, including A1c, cholesterol and blood pressure.   2. Primary hypertension: Blood pressure was high during the event and in the days afterward but starting this week his blood pressure is starting to normalize. Continue Amlodipine 5 mg daily and monitoring blood pressure at home. Follow up here in 1 month for recheck. Refills ordered today.   -  amLODipine (NORVASC) 5 MG tablet; Take 1 tablet (5 mg total) by mouth daily.  Dispense: 90 tablet; Refill: 0  3. Mixed hyperlipidemia: Reviewed last lipid panel with the patient, LDL 60. Continue Crestor 40 mg.   4. Tobacco abuse: Has not smoked since he was discharged from the hospital 9 days ago, efforts congratulated. Has nicotine patches, understands how this will only improve his health. Continue to monitor.   5. Prediabetes: A1c 5.9%. Discussed decreasing sugar and carbs in the diet, plan to recheck in 3 months.     Return in about 4 weeks (around 07/02/2021).    Margarita Mail, DO

## 2021-06-11 NOTE — Addendum Note (Signed)
Addended by: Teodora Medici on: 06/11/2021 11:57 AM   Modules accepted: Level of Service

## 2021-06-26 DIAGNOSIS — H3412 Central retinal artery occlusion, left eye: Secondary | ICD-10-CM | POA: Diagnosis not present

## 2021-07-14 NOTE — Progress Notes (Signed)
Established Patient Office Visit  Subjective   Patient ID: Martin Cook, male    DOB: 04/07/1960  Age: 61 y.o. MRN: [id number]  Chief Complaint  Patient presents with   Follow-up    4 week follow up    HPI Martin Cook is a 61 year old male here for follow up.  Central Retinal Artery Occlusion: -Presented with vision loss in the bottom half of his  left eye suddenly when he woke up on the morning of 05/30/21, seen on exam by Ophthalmology. MRI of the brain negative for acute intracranial process, without evidence of acute or subacute infarct. CTA head and neck 05/25/21 - the non-dominant right vertberal artery becomes occluded immediately beyond the right PICA origin wihtout other large vessel occlusion. Asymmetrically diminished enhancement of the cervical right vertebral artery, common carotid and internal carotid arteries are patent, up to 40-50% stenosis in the proximal right ICA -Evaluated by Neuro who recommended starting on aspirin, Plavix x 3 weeks and then only aspirin. At this point has finished 21 days of duel anticoagulation therapy and is just on aspirin daily.  -Echo results 05/26/21: EF 60-65% with normal function of the left ventricle, no valvular abnormalities  -Inflammatory markers negative, LDL 60, A1c 5.9 -Vision is starting to return, only about 10% of vision in right eye lost currently, returning more and more each day.  -Following with Ophthalmology in 1 year  Abnormal Hgb: -History of heavy smoking at least 40 years, on nicotine patches now, trying to quit. Hasn't smoked since he was discharged from the hospital  -Hbg 12.7 on 05/25/21, 16.2 prior   Hypertension: -Medications: Amlodipine 5 mg, had been on 2.5 mg previously  -Patient is compliant with above medications and reports no side effects. -Checking BP at home (average): before bed 120-130/80  -Denies any SOB, CP, LE edema or symptoms of hypotension  HLD: -Medications: Crestor 40 mg -Patient is compliant  with above medications and reports no side effects.  -Last lipid panel: Lipid Panel     Component Value Date/Time   CHOL 152 05/26/2021 0502   CHOL 185 06/09/2015 0854   TRIG 183 (H) 05/26/2021 0502   HDL 55 05/26/2021 0502   HDL 57 06/09/2015 0854   CHOLHDL 2.8 05/26/2021 0502   VLDL 37 05/26/2021 0502   LDLCALC 60 05/26/2021 0502   LDLCALC 81 01/28/2021 0920   LABVLDL 44 (H) 06/09/2015 0854   The 10-year ASCVD risk score (Arnett DK, et al., 2019) is: 12.9%   Values used to calculate the score:     Age: 35 years     Sex: Male     Is Non-Hispanic African American: No     Diabetic: No     Tobacco smoker: Yes     Systolic Blood Pressure: 138 mmHg     Is BP treated: No     HDL Cholesterol: 55 mg/dL     Total Cholesterol: 152 mg/dL  GERD: -Currently on Pepcid 20 mg daily, working on avoiding trigger foods   Pre-Diabetes: -A1c 05/25/21 5.9, had been 6.3% in January  -Not currently on medications   Review of Systems  Constitutional:  Negative for chills and fever.  Eyes:  Negative for blurred vision, pain, discharge and redness.  Respiratory:  Negative for shortness of breath.   Cardiovascular:  Negative for chest pain.  Neurological:  Negative for dizziness.      Objective:     BP 138/74 (BP Location: Left Arm, Patient Position: Sitting)  Pulse 83   Temp 97.7 F (36.5 C) (Oral)   Resp 16   Ht 5\' 7"  (1.702 m)   Wt 196 lb 12.8 oz (89.3 kg)   SpO2 93%   BMI 30.82 kg/m  BP Readings from Last 3 Encounters:  07/15/21 138/74  06/04/21 128/72  05/26/21 (!) 150/84   Wt Readings from Last 3 Encounters:  07/15/21 196 lb 12.8 oz (89.3 kg)  06/04/21 198 lb 11.2 oz (90.1 kg)  05/25/21 192 lb 7.4 oz (87.3 kg)      Physical Exam Constitutional:      Appearance: Normal appearance.  HENT:     Head: Normocephalic and atraumatic.  Eyes:     Conjunctiva/sclera: Conjunctivae normal.  Cardiovascular:     Rate and Rhythm: Normal rate and regular rhythm.  Pulmonary:      Effort: Pulmonary effort is normal.     Breath sounds: Normal breath sounds.  Musculoskeletal:     Right lower leg: No edema.     Left lower leg: No edema.  Skin:    General: Skin is warm and dry.  Neurological:     General: No focal deficit present.     Mental Status: He is alert. Mental status is at baseline.  Psychiatric:        Mood and Affect: Mood normal.        Behavior: Behavior normal.      No results found for any visits on 07/15/21.  Last CBC Lab Results  Component Value Date   WBC 7.7 05/25/2021   HGB 17.2 (H) 05/25/2021   HCT 52.5 (H) 05/25/2021   MCV 94.4 05/25/2021   MCH 30.9 05/25/2021   RDW 12.9 05/25/2021   PLT 202 05/25/2021   Last metabolic panel Lab Results  Component Value Date   GLUCOSE 132 (H) 05/25/2021   NA 139 05/25/2021   K 3.7 05/25/2021   CL 102 05/25/2021   CO2 26 05/25/2021   BUN 13 05/25/2021   CREATININE 0.85 05/25/2021   GFRNONAA >60 05/25/2021   CALCIUM 9.6 05/25/2021   PROT 8.0 05/25/2021   ALBUMIN 4.6 05/25/2021   LABGLOB 2.1 06/09/2015   AGRATIO 2.2 06/09/2015   BILITOT 1.1 05/25/2021   ALKPHOS 67 05/25/2021   AST 33 05/25/2021   ALT 27 05/25/2021   ANIONGAP 11 05/25/2021   Last lipids Lab Results  Component Value Date   CHOL 152 05/26/2021   HDL 55 05/26/2021   LDLCALC 60 05/26/2021   TRIG 183 (H) 05/26/2021   CHOLHDL 2.8 05/26/2021   Last hemoglobin A1c Lab Results  Component Value Date   HGBA1C 5.9 (H) 05/25/2021   Last thyroid functions Lab Results  Component Value Date   TSH 1.50 02/16/2017   Last vitamin D Lab Results  Component Value Date   VD25OH 35 07/25/2020   Last vitamin B12 and Folate No results found for: "VITAMINB12", "FOLATE"    The 10-year ASCVD risk score (Arnett DK, et al., 2019) is: 15%    Assessment & Plan:   1. Primary hypertension: Blood pressure recheck today, blood pressure in the office today still borderline at 138/74.  Patient states at night his blood pressure is  better controlled but during the day it is "all over the place".  Need to have tight control of blood pressure in order to modify risk factors since the central right artery occlusion in May.  At this point we will increase his amlodipine to 10 mg daily.  He will continue to  monitor his blood pressure at home.  He will follow-up in 6 weeks for recheck of blood pressure and A1c recheck.  - amLODipine (NORVASC) 10 MG tablet; Take 1 tablet (10 mg total) by mouth daily.  Dispense: 90 tablet; Refill: 1   Return in about 6 weeks (around 08/26/2021).    Margarita Mail, DO

## 2021-07-15 ENCOUNTER — Ambulatory Visit: Payer: BLUE CROSS/BLUE SHIELD | Admitting: Internal Medicine

## 2021-07-15 ENCOUNTER — Encounter: Payer: Self-pay | Admitting: Internal Medicine

## 2021-07-15 VITALS — BP 138/74 | HR 83 | Temp 97.7°F | Resp 16 | Ht 67.0 in | Wt 196.8 lb

## 2021-07-15 DIAGNOSIS — I10 Essential (primary) hypertension: Secondary | ICD-10-CM

## 2021-07-15 MED ORDER — AMLODIPINE BESYLATE 10 MG PO TABS: 10.0000 mg | ORAL_TABLET | Freq: Every day | ORAL | 1 refills | Status: DC

## 2021-07-15 NOTE — Patient Instructions (Addendum)
It was great seeing you today!  Plan discussed at today's visit: -Increase Amlodipine to 10 mg - can take 2 5 mg tablets until you run out  -Continue to check blood pressure at home   Follow up in: 6 weeks   Take care and let us know if you have any questions or concerns prior to your next visit.  Dr. Caralee Ates

## 2021-07-16 ENCOUNTER — Other Ambulatory Visit: Payer: Self-pay | Admitting: Family Medicine

## 2021-07-16 DIAGNOSIS — E7800 Pure hypercholesterolemia, unspecified: Secondary | ICD-10-CM

## 2021-07-16 DIAGNOSIS — K219 Gastro-esophageal reflux disease without esophagitis: Secondary | ICD-10-CM

## 2021-08-18 NOTE — Progress Notes (Unsigned)
Established Patient Office Visit  Subjective   Patient ID: Martin Cook, male    DOB: [birth year]  Age: 61 y.o. MRN: [id number]  No chief complaint on file.   HPI Martin Cook is a 61 year old male here for follow up.  Central Retinal Artery Occlusion: -Presented with vision loss in the bottom half of his  left eye suddenly when he woke up on the morning of 05/30/21, seen on exam by Ophthalmology. MRI of the brain negative for acute intracranial process, without evidence of acute or subacute infarct. CTA head and neck 05/25/21 - the non-dominant right vertberal artery becomes occluded immediately beyond the right PICA origin wihtout other large vessel occlusion. Asymmetrically diminished enhancement of the cervical right vertebral artery, common carotid and internal carotid arteries are patent, up to 40-50% stenosis in the proximal right ICA -Evaluated by Neuro who recommended starting on aspirin, Plavix x 3 weeks and then only aspirin. At this point has finished 21 days of duel anticoagulation therapy and is just on aspirin daily.  -Echo results 05/26/21: EF 60-65% with normal function of the left ventricle, no valvular abnormalities  -Inflammatory markers negative, LDL 60, A1c 5.9 -Vision is starting to return, only about 10% of vision in right eye lost currently, returning more and more each day.  -Following with Ophthalmology in 1 year  Abnormal Hgb: -History of heavy smoking at least 40 years, on nicotine patches now, trying to quit. Hasn't smoked since he was discharged from the hospital  -Hbg 12.7 on 05/25/21, 16.2 prior   Hypertension: -Medications: Amlodipine 10 mg, increased at last office visit -Patient is compliant with above medications and reports no side effects. -Checking BP at home (average): before bed 120-130/80  -Denies any SOB, CP, LE edema or symptoms of hypotension  HLD: -Medications: Crestor 40 mg -Patient is compliant with above medications and reports no side  effects.  -Last lipid panel: Lipid Panel     Component Value Date/Time   CHOL 152 05/26/2021 0502   CHOL 185 06/09/2015 0854   TRIG 183 (H) 05/26/2021 0502   HDL 55 05/26/2021 0502   HDL 57 06/09/2015 0854   CHOLHDL 2.8 05/26/2021 0502   VLDL 37 05/26/2021 0502   LDLCALC 60 05/26/2021 0502   LDLCALC 81 01/28/2021 0920   LABVLDL 44 (H) 06/09/2015 0854   The 10-year ASCVD risk score (Arnett DK, et al., 2019) is: 15%   Values used to calculate the score:     Age: 19 years     Sex: Male     Is Non-Hispanic African American: No     Diabetic: No     Tobacco smoker: Yes     Systolic Blood Pressure: 138 mmHg     Is BP treated: Yes     HDL Cholesterol: 55 mg/dL     Total Cholesterol: 152 mg/dL  GERD: -Currently on Pepcid 20 mg daily, working on avoiding trigger foods   Pre-Diabetes: -A1c 05/25/21 5.9, had been 6.3% in January  -Not currently on medications   Review of Systems  Constitutional:  Negative for chills and fever.  Eyes:  Negative for blurred vision, pain, discharge and redness.  Respiratory:  Negative for shortness of breath.   Cardiovascular:  Negative for chest pain.  Neurological:  Negative for dizziness.      Objective:     There were no vitals taken for this visit. BP Readings from Last 3 Encounters:  07/15/21 138/74  06/04/21 128/72  05/26/21 (!) 150/84  Wt Readings from Last 3 Encounters:  07/15/21 196 lb 12.8 oz (89.3 kg)  06/04/21 198 lb 11.2 oz (90.1 kg)  05/25/21 192 lb 7.4 oz (87.3 kg)      Physical Exam Constitutional:      Appearance: Normal appearance.  HENT:     Head: Normocephalic and atraumatic.  Eyes:     Conjunctiva/sclera: Conjunctivae normal.  Cardiovascular:     Rate and Rhythm: Normal rate and regular rhythm.  Pulmonary:     Effort: Pulmonary effort is normal.     Breath sounds: Normal breath sounds.  Musculoskeletal:     Right lower leg: No edema.     Left lower leg: No edema.  Skin:    General: Skin is warm and dry.   Neurological:     General: No focal deficit present.     Mental Status: He is alert. Mental status is at baseline.  Psychiatric:        Mood and Affect: Mood normal.        Behavior: Behavior normal.      No results found for any visits on 08/19/21.  Last CBC Lab Results  Component Value Date   WBC 7.7 05/25/2021   HGB 17.2 (H) 05/25/2021   HCT 52.5 (H) 05/25/2021   MCV 94.4 05/25/2021   MCH 30.9 05/25/2021   RDW 12.9 05/25/2021   PLT 202 05/25/2021   Last metabolic panel Lab Results  Component Value Date   GLUCOSE 132 (H) 05/25/2021   NA 139 05/25/2021   K 3.7 05/25/2021   CL 102 05/25/2021   CO2 26 05/25/2021   BUN 13 05/25/2021   CREATININE 0.85 05/25/2021   GFRNONAA >60 05/25/2021   CALCIUM 9.6 05/25/2021   PROT 8.0 05/25/2021   ALBUMIN 4.6 05/25/2021   LABGLOB 2.1 06/09/2015   AGRATIO 2.2 06/09/2015   BILITOT 1.1 05/25/2021   ALKPHOS 67 05/25/2021   AST 33 05/25/2021   ALT 27 05/25/2021   ANIONGAP 11 05/25/2021   Last lipids Lab Results  Component Value Date   CHOL 152 05/26/2021   HDL 55 05/26/2021   LDLCALC 60 05/26/2021   TRIG 183 (H) 05/26/2021   CHOLHDL 2.8 05/26/2021   Last hemoglobin A1c Lab Results  Component Value Date   HGBA1C 5.9 (H) 05/25/2021   Last thyroid functions Lab Results  Component Value Date   TSH 1.50 02/16/2017   Last vitamin D Lab Results  Component Value Date   VD25OH 35 07/25/2020   Last vitamin B12 and Folate No results found for: "VITAMINB12", "FOLATE"    The 10-year ASCVD risk score (Arnett DK, et al., 2019) is: 15%    Assessment & Plan:   1. Primary hypertension: Blood pressure recheck today, blood pressure in the office today still borderline at 138/74.  Patient states at night his blood pressure is better controlled but during the day it is "all over the place".  Need to have tight control of blood pressure in order to modify risk factors since the central right artery occlusion in May.  At this  point we will increase his amlodipine to 10 mg daily.  He will continue to monitor his blood pressure at home.  He will follow-up in 6 weeks for recheck of blood pressure and A1c recheck.  - amLODipine (NORVASC) 10 MG tablet; Take 1 tablet (10 mg total) by mouth daily.  Dispense: 90 tablet; Refill: 1   No follow-ups on file.    Margarita Mail, DO

## 2021-08-19 ENCOUNTER — Ambulatory Visit: Payer: BLUE CROSS/BLUE SHIELD | Admitting: Internal Medicine

## 2021-08-19 ENCOUNTER — Encounter: Payer: Self-pay | Admitting: Internal Medicine

## 2021-08-19 VITALS — BP 122/84 | HR 77 | Resp 16 | Ht 67.0 in | Wt 199.6 lb

## 2021-08-19 DIAGNOSIS — F17200 Nicotine dependence, unspecified, uncomplicated: Secondary | ICD-10-CM

## 2021-08-19 DIAGNOSIS — I10 Essential (primary) hypertension: Secondary | ICD-10-CM

## 2021-08-19 NOTE — Patient Instructions (Addendum)
It was great seeing you today!  Plan discussed at today's visit: -Continue medications, no changes made today -Continue checking blood pressure at home -Continue to work on diet, decreasing sodium and increasing activity levels -Consider tetanus vaccine, colon cancer screening and lung cancer screening   Follow up in: 3 months  Take care and let us know if you have any questions or concerns prior to your next visit.  Dr. Caralee Ates

## 2021-09-02 ENCOUNTER — Telehealth: Payer: Self-pay | Admitting: Internal Medicine

## 2021-09-02 DIAGNOSIS — I10 Essential (primary) hypertension: Secondary | ICD-10-CM

## 2021-09-02 NOTE — Telephone Encounter (Signed)
dc'd 07/15/21 Dr. Caralee Ates "dose change"   Requested Prescriptions  Refused Prescriptions Disp Refills  . amLODipine (NORVASC) 5 MG tablet [Pharmacy Med Name: AMLODIPINE BESYLATE 5 MG TAB] 90 tablet 0    Sig: TAKE 1 TABLET (5 MG TOTAL) BY MOUTH DAILY.     Cardiovascular: Calcium Channel Blockers 2 Passed - 09/02/2021  1:58 AM      Passed - Last BP in normal range    BP Readings from Last 1 Encounters:  08/19/21 122/84         Passed - Last Heart Rate in normal range    Pulse Readings from Last 1 Encounters:  08/19/21 77         Passed - Valid encounter within last 6 months    Recent Outpatient Visits          2 weeks ago Primary hypertension   [facility] Margarita Mail, DO   1 month ago Primary hypertension   [facility] Margarita Mail, DO   3 months ago Hospital discharge follow-up   [facility] Margarita Mail, DO   6 months ago Primary hypertension   [facility] Gabriel Cirri, NP   7 months ago Primary hypertension   [facility] Gabriel Cirri, NP      Future Appointments            In 2 months Margarita Mail, DO [facility]

## 2021-11-20 ENCOUNTER — Encounter: Payer: Self-pay | Admitting: Internal Medicine

## 2021-11-20 ENCOUNTER — Ambulatory Visit: Payer: BLUE CROSS/BLUE SHIELD | Admitting: Internal Medicine

## 2021-11-20 VITALS — BP 138/78 | HR 85 | Temp 97.9°F | Resp 16 | Ht 67.0 in | Wt 197.0 lb

## 2021-11-20 DIAGNOSIS — I10 Essential (primary) hypertension: Secondary | ICD-10-CM

## 2021-11-20 DIAGNOSIS — Z720 Tobacco use: Secondary | ICD-10-CM

## 2021-11-20 DIAGNOSIS — E782 Mixed hyperlipidemia: Secondary | ICD-10-CM

## 2021-11-20 DIAGNOSIS — R7303 Prediabetes: Secondary | ICD-10-CM

## 2021-11-20 LAB — POCT GLYCOSYLATED HEMOGLOBIN (HGB A1C): Hemoglobin A1C: 6.4 % — AB (ref 4.0–5.6)

## 2021-11-20 MED ORDER — AMLODIPINE BESYLATE 10 MG PO TABS: 10.0000 mg | ORAL_TABLET | Freq: Every day | ORAL | 1 refills | Status: DC

## 2021-11-20 NOTE — Progress Notes (Signed)
Established Patient Office Visit  Subjective   Patient ID: Martin Cook, male    DOB: 27-Jan-1960  Age: 61 y.o. MRN: [id number]  Chief Complaint  Patient presents with   Follow-up   Diabetes   Hyperlipidemia   Hypertension    HPI Martin Cook is a 61 year old male here for follow up.  Central Retinal Artery Occlusion: -Presented with vision loss in the bottom half of his  left eye suddenly when he woke up on the morning of 05/30/21, seen on exam by Ophthalmology. MRI of the brain negative for acute intracranial process, without evidence of acute or subacute infarct. CTA head and neck 05/25/21 - the non-dominant right vertberal artery becomes occluded immediately beyond the right PICA origin wihtout other large vessel occlusion. Asymmetrically diminished enhancement of the cervical right vertebral artery, common carotid and internal carotid arteries are patent, up to 40-50% stenosis in the proximal right ICA -Evaluated by Neuro who recommended starting on aspirin, Plavix x 3 weeks and then only aspirin. At this point has finished 21 days of duel anticoagulation therapy and is just on aspirin daily and tolerating well. -Echo results 05/26/21: EF 60-65% with normal function of the left ventricle, no valvular abnormalities  -Inflammatory markers negative, LDL 60, A1c 5.9 -Vision continues to slowly return, does occasionally have dry eye -Following with Ophthalmology in 1 year  Abnormal Hgb: -History of heavy smoking at least 40 years, had been on nicotine patches and had stopped smoking after getting out of the hospital but restarted -Lung cancer screening via low dose CT in 2019 lung RADS 1, however patient is not interested in annual lung cancer screening. Patient continues to smoke and declines lung cancer screening. -Hbg 12.7 on 05/25/21, 16.2 prior   Hypertension: -Medications: Amlodipine 10 mg -Patient is compliant with above medications and reports no side effects. -Checking BP at home  (average): before bed 120-130/80  -Denies any SOB, CP, LE edema or symptoms of hypotension  HLD: -Medications: Crestor 40 mg -Patient is compliant with above medications and reports no side effects.  -Last lipid panel: Lipid Panel     Component Value Date/Time   CHOL 152 05/26/2021 0502   CHOL 185 06/09/2015 0854   TRIG 183 (H) 05/26/2021 0502   HDL 55 05/26/2021 0502   HDL 57 06/09/2015 0854   CHOLHDL 2.8 05/26/2021 0502   VLDL 37 05/26/2021 0502   LDLCALC 60 05/26/2021 0502   LDLCALC 81 01/28/2021 0920   LABVLDL 44 (H) 06/09/2015 0854   The 10-year ASCVD risk score (Arnett DK, et al., 2019) is: 15%   Values used to calculate the score:     Age: 50 years     Sex: Male     Is Non-Hispanic African American: No     Diabetic: No     Tobacco smoker: Yes     Systolic Blood Pressure: 138 mmHg     Is BP treated: Yes     HDL Cholesterol: 55 mg/dL     Total Cholesterol: 152 mg/dL  GERD: -Currently on Pepcid 20 mg daily, working on avoiding trigger foods   Pre-Diabetes: -A1c 05/25/21 5.9, had been 6.3% in January  -Not currently on medications   Health maintenance: -Blood work up-to-date -Never had colon cancer screening, patient states he is not interested despite risks -Lung cancer screening done once in 2019, given the patient is currently smoking he is not interested in continuing with lung cancer screening -Tdap due, despite potential risks patient is not  interested in this vaccine today.  Review of Systems  Constitutional:  Negative for chills and fever.  Eyes:  Negative for blurred vision, pain, discharge and redness.  Respiratory:  Negative for shortness of breath.   Cardiovascular:  Negative for chest pain.  Neurological:  Negative for dizziness.      Objective:     BP 138/78   Pulse 85   Temp 97.9 F (36.6 C)   Resp 16   Ht 5\' 7"  (1.702 m)   Wt 197 lb (89.4 kg)   SpO2 93%   BMI 30.85 kg/m  BP Readings from Last 3 Encounters:  11/20/21 138/78   08/19/21 122/84  07/15/21 138/74   Wt Readings from Last 3 Encounters:  11/20/21 197 lb (89.4 kg)  08/19/21 199 lb 9.6 oz (90.5 kg)  07/15/21 196 lb 12.8 oz (89.3 kg)      Physical Exam Constitutional:      Appearance: Normal appearance.  HENT:     Head: Normocephalic and atraumatic.  Eyes:     Conjunctiva/sclera: Conjunctivae normal.  Cardiovascular:     Rate and Rhythm: Normal rate and regular rhythm.  Pulmonary:     Effort: Pulmonary effort is normal.     Breath sounds: Normal breath sounds.  Skin:    General: Skin is warm and dry.  Neurological:     General: No focal deficit present.     Mental Status: He is alert. Mental status is at baseline.  Psychiatric:        Mood and Affect: Mood normal.        Behavior: Behavior normal.      Results for orders placed or performed in visit on 11/20/21  POCT HgB A1C  Result Value Ref Range   Hemoglobin A1C 6.4 (A) 4.0 - 5.6 %   HbA1c POC (<> result, manual entry)     HbA1c, POC (prediabetic range)     HbA1c, POC (controlled diabetic range)      Last CBC Lab Results  Component Value Date   WBC 7.7 05/25/2021   HGB 17.2 (H) 05/25/2021   HCT 52.5 (H) 05/25/2021   MCV 94.4 05/25/2021   MCH 30.9 05/25/2021   RDW 12.9 05/25/2021   PLT 202 01/75/1025   Last metabolic panel Lab Results  Component Value Date   GLUCOSE 132 (H) 05/25/2021   NA 139 05/25/2021   K 3.7 05/25/2021   CL 102 05/25/2021   CO2 26 05/25/2021   BUN 13 05/25/2021   CREATININE 0.85 05/25/2021   GFRNONAA >60 05/25/2021   CALCIUM 9.6 05/25/2021   PROT 8.0 05/25/2021   ALBUMIN 4.6 05/25/2021   LABGLOB 2.1 06/09/2015   AGRATIO 2.2 06/09/2015   BILITOT 1.1 05/25/2021   ALKPHOS 67 05/25/2021   AST 33 05/25/2021   ALT 27 05/25/2021   ANIONGAP 11 05/25/2021   Last lipids Lab Results  Component Value Date   CHOL 152 05/26/2021   HDL 55 05/26/2021   LDLCALC 60 05/26/2021   TRIG 183 (H) 05/26/2021   CHOLHDL 2.8 05/26/2021   Last  hemoglobin A1c Lab Results  Component Value Date   HGBA1C 6.4 (A) 11/20/2021   Last thyroid functions Lab Results  Component Value Date   TSH 1.50 02/16/2017   Last vitamin D Lab Results  Component Value Date   VD25OH 35 07/25/2020   Last vitamin B12 and Folate No results found for: "VITAMINB12", "FOLATE"    The 10-year ASCVD risk score (Arnett DK, et al., 2019) is:  15%    Assessment & Plan:   1. Primary hypertension: Chronic.  Blood pressure borderline.  Continue amlodipine 10 mg daily, refilled today.  - amLODipine (NORVASC) 10 MG tablet; Take 1 tablet (10 mg total) by mouth daily.  Dispense: 90 tablet; Refill: 1  2. Prediabetes: A1c 6.4%.  Recheck in 6 months.  Patient is working on diet.  - POCT HgB A1C  3. Mixed hyperlipidemia: Chronic.  Continue Crestor 40 mg daily, plan to recheck fasting labs at follow-up.  4. Tobacco abuse: Patient continues to smoke and continues to decline tobacco cessation therapy and lung cancer screening.  Continue to monitor.   Return in about 6 months (around 05/21/2022).    Teodora Medici, DO

## 2021-11-20 NOTE — Patient Instructions (Addendum)
It was great seeing you today!  Plan discussed at today's visit: -A1c here 6.4%  -Medication refilled - no changes made today -Plan for fasting labs next time   Follow up in: 6 months   Take care and let us know if you have any questions or concerns prior to your next visit.  Dr. Rosana Berger

## 2022-05-19 NOTE — Progress Notes (Signed)
Established Patient Office Visit  Subjective   Patient ID: Martin Cook, male    DOB: October 13, 1960  Age: 62 y.o. MRN: [id number]  Chief Complaint  Patient presents with   Follow-up    HPI  Patient is here for follow up on blood pressure.  Overall he is doing well and reports no changes since his last office visit.  History of Central Retinal Artery Occlusion: -Presented with vision loss in the bottom half of his  left eye suddenly when he woke up on the morning of 05/30/21, seen on exam by Ophthalmology. MRI of the brain negative for acute intracranial process, without evidence of acute or subacute infarct. CTA head and neck 05/25/21 - the non-dominant right vertberal artery becomes occluded immediately beyond the right PICA origin wihtout other large vessel occlusion. Asymmetrically diminished enhancement of the cervical right vertebral artery, common carotid and internal carotid arteries are patent, up to 40-50% stenosis in the proximal right ICA -Evaluated by Neuro who recommended starting on aspirin, Plavix x 3 weeks and then only aspirin. At this point has finished 21 days of duel anticoagulation therapy and is just on aspirin daily and tolerating well. -Echo results 05/26/21: EF 60-65% with normal function of the left ventricle, no valvular abnormalities  -Inflammatory markers negative, LDL 60, A1c 5.9 -Vision continues to slowly return, does occasionally have dry eye -Following with Ophthalmology  -Reports no visual changes  Abnormal Hgb: -History of heavy smoking at least 40 years, had been on nicotine patches and had stopped smoking after getting out of the hospital but restarted -Lung cancer screening via low dose CT in 2019 lung RADS 1, however patient is not interested in annual lung cancer screening. Patient continues to smoke and declines lung cancer screening. -Hbg 12.7 on 05/25/21, 16.2 prior   Hypertension: -Medications: Amlodipine 10 mg -Patient is compliant with above  medications and reports no side effects. -Checking BP at home (average): No longer checking regularly -Denies any SOB, CP, LE edema or symptoms of hypotension  HLD: -Medications: Crestor 40 mg -Patient is compliant with above medications and reports no side effects.  -Last lipid panel: Lipid Panel     Component Value Date/Time   CHOL 152 05/26/2021 0502   CHOL 185 06/09/2015 0854   TRIG 183 (H) 05/26/2021 0502   HDL 55 05/26/2021 0502   HDL 57 06/09/2015 0854   CHOLHDL 2.8 05/26/2021 0502   VLDL 37 05/26/2021 0502   LDLCALC 60 05/26/2021 0502   LDLCALC 81 01/28/2021 0920   LABVLDL 44 (H) 06/09/2015 0854   The 10-year ASCVD risk score (Arnett DK, et al., 2019) is: 13.6%   Values used to calculate the score:     Age: 52 years     Sex: Male     Is Non-Hispanic African American: No     Diabetic: No     Tobacco smoker: Yes     Systolic Blood Pressure: 130 mmHg     Is BP treated: Yes     HDL Cholesterol: 55 mg/dL     Total Cholesterol: 152 mg/dL  GERD: -Currently on Pepcid 20 mg daily, working on avoiding trigger foods   Pre-Diabetes: -A1c 6.4% 11/23 -Not currently on medications  -Has been working on diet changes and has lost 4 pounds since her last visit  Health maintenance: -Blood work due -Never had colon cancer screening, patient states he is not interested despite risks -Lung cancer screening done once in 2019, given the patient is currently smoking he  is not interested in continuing with lung cancer screening -Tdap due, continues to decline vaccines  Review of Systems  Constitutional:  Negative for chills and fever.  Eyes:  Negative for blurred vision, pain, discharge and redness.  Respiratory:  Negative for shortness of breath.   Cardiovascular:  Negative for chest pain.  Neurological:  Negative for dizziness.      Objective:     BP 130/70   Pulse 81   Temp 97.6 F (36.4 C)   Resp 16   Ht 5\' 7"  (1.702 m)   Wt 195 lb 12.8 oz (88.8 kg)   SpO2 93%    BMI 30.67 kg/m  BP Readings from Last 3 Encounters:  05/21/22 130/70  11/20/21 138/78  08/19/21 122/84   Wt Readings from Last 3 Encounters:  05/21/22 195 lb 12.8 oz (88.8 kg)  11/20/21 197 lb (89.4 kg)  08/19/21 199 lb 9.6 oz (90.5 kg)      Physical Exam Constitutional:      Appearance: Normal appearance.  HENT:     Head: Normocephalic and atraumatic.  Eyes:     Conjunctiva/sclera: Conjunctivae normal.  Cardiovascular:     Rate and Rhythm: Normal rate and regular rhythm.  Pulmonary:     Effort: Pulmonary effort is normal.     Breath sounds: Normal breath sounds.  Skin:    General: Skin is warm and dry.  Neurological:     General: No focal deficit present.     Mental Status: He is alert. Mental status is at baseline.  Psychiatric:        Mood and Affect: Mood normal.        Behavior: Behavior normal.      No results found for any visits on 05/21/22.   Last CBC Lab Results  Component Value Date   WBC 7.7 05/25/2021   HGB 17.2 (H) 05/25/2021   HCT 52.5 (H) 05/25/2021   MCV 94.4 05/25/2021   MCH 30.9 05/25/2021   RDW 12.9 05/25/2021   PLT 202 05/25/2021   Last metabolic panel Lab Results  Component Value Date   GLUCOSE 132 (H) 05/25/2021   NA 139 05/25/2021   K 3.7 05/25/2021   CL 102 05/25/2021   CO2 26 05/25/2021   BUN 13 05/25/2021   CREATININE 0.85 05/25/2021   GFRNONAA >60 05/25/2021   CALCIUM 9.6 05/25/2021   PROT 8.0 05/25/2021   ALBUMIN 4.6 05/25/2021   LABGLOB 2.1 06/09/2015   AGRATIO 2.2 06/09/2015   BILITOT 1.1 05/25/2021   ALKPHOS 67 05/25/2021   AST 33 05/25/2021   ALT 27 05/25/2021   ANIONGAP 11 05/25/2021   Last lipids Lab Results  Component Value Date   CHOL 152 05/26/2021   HDL 55 05/26/2021   LDLCALC 60 05/26/2021   TRIG 183 (H) 05/26/2021   CHOLHDL 2.8 05/26/2021   Last hemoglobin A1c Lab Results  Component Value Date   HGBA1C 6.4 (A) 11/20/2021   Last thyroid functions Lab Results  Component Value Date   TSH  1.50 02/16/2017   Last vitamin D Lab Results  Component Value Date   VD25OH 35 07/25/2020   Last vitamin B12 and Folate No results found for: "VITAMINB12", "FOLATE"    The 10-year ASCVD risk score (Arnett DK, et al., 2019) is: 13.6%    Assessment & Plan:   1. Primary hypertension: Chronic, controlled.  Continue amlodipine 10 mg daily, refilled.  Labs due.  - CBC w/Diff/Platelet - COMPLETE METABOLIC PANEL WITH GFR - amLODipine (NORVASC)  10 MG tablet; Take 1 tablet (10 mg total) by mouth daily.  Dispense: 90 tablet; Refill: 1  2. Pure hypercholesterolemia: Recheck cholesterol today.  Continue Crestor 40 mg, refilled.  - Lipid Profile - rosuvastatin (CRESTOR) 40 MG tablet; Take 1 tablet (40 mg total) by mouth daily.  Dispense: 90 tablet; Refill: 1  3. Prediabetes: Recheck A1c, not currently on any medications but is working on diet change.  - HgB A1c  4. Gastro-esophageal reflux disease without esophagitis: Symptoms well-controlled with Pepcid 20 mg daily, refilled.  - famotidine (PEPCID) 20 MG tablet; Take 1 tablet (20 mg total) by mouth daily as needed for heartburn or indigestion.  Dispense: 90 tablet; Refill: 1  5. Prostate cancer screening: Check PSA with above labs.  - PSA   Return in about 6 months (around 11/21/2022).    Margarita Mail, DO

## 2022-05-21 ENCOUNTER — Encounter: Payer: Self-pay | Admitting: Internal Medicine

## 2022-05-21 ENCOUNTER — Ambulatory Visit: Payer: BLUE CROSS/BLUE SHIELD | Admitting: Internal Medicine

## 2022-05-21 VITALS — BP 130/70 | HR 81 | Temp 97.6°F | Resp 16 | Ht 67.0 in | Wt 195.8 lb

## 2022-05-21 DIAGNOSIS — I10 Essential (primary) hypertension: Secondary | ICD-10-CM | POA: Diagnosis not present

## 2022-05-21 DIAGNOSIS — Z125 Encounter for screening for malignant neoplasm of prostate: Secondary | ICD-10-CM

## 2022-05-21 DIAGNOSIS — E7800 Pure hypercholesterolemia, unspecified: Secondary | ICD-10-CM | POA: Diagnosis not present

## 2022-05-21 DIAGNOSIS — K219 Gastro-esophageal reflux disease without esophagitis: Secondary | ICD-10-CM

## 2022-05-21 DIAGNOSIS — R7303 Prediabetes: Secondary | ICD-10-CM

## 2022-05-21 MED ORDER — AMLODIPINE BESYLATE 10 MG PO TABS: 10.0000 mg | ORAL_TABLET | Freq: Every day | ORAL | 1 refills | Status: DC

## 2022-05-21 MED ORDER — FAMOTIDINE 20 MG PO TABS
20.0000 mg | ORAL_TABLET | Freq: Every day | ORAL | 1 refills | Status: DC | PRN
Start: 2022-05-21 — End: 2022-11-22

## 2022-05-21 MED ORDER — ROSUVASTATIN CALCIUM 40 MG PO TABS: 40.0000 mg | ORAL_TABLET | Freq: Every day | ORAL | 1 refills | Status: DC

## 2022-05-22 LAB — CBC WITH DIFFERENTIAL/PLATELET
Absolute Monocytes: 400 cells/uL (ref 200–950)
Basophils Absolute: 59 cells/uL (ref 0–200)
Basophils Relative: 1.1 %
Eosinophils Absolute: 70 cells/uL (ref 15–500)
Eosinophils Relative: 1.3 %
HCT: 46.8 % (ref 38.5–50.0)
Hemoglobin: 15.7 g/dL (ref 13.2–17.1)
Lymphs Abs: 1318 cells/uL (ref 850–3900)
MCH: 31 pg (ref 27.0–33.0)
MCHC: 33.5 g/dL (ref 32.0–36.0)
MCV: 92.5 fL (ref 80.0–100.0)
MPV: 10.3 fL (ref 7.5–12.5)
Monocytes Relative: 7.4 %
Neutro Abs: 3553 cells/uL (ref 1500–7800)
Neutrophils Relative %: 65.8 %
Platelets: 221 10*3/uL (ref 140–400)
RBC: 5.06 10*6/uL (ref 4.20–5.80)
RDW: 13.1 % (ref 11.0–15.0)
Total Lymphocyte: 24.4 %
WBC: 5.4 10*3/uL (ref 3.8–10.8)

## 2022-05-22 LAB — COMPLETE METABOLIC PANEL WITH GFR
AG Ratio: 2.1 (calc) (ref 1.0–2.5)
ALT: 23 U/L (ref 9–46)
AST: 22 U/L (ref 10–35)
Albumin: 4.6 g/dL (ref 3.6–5.1)
Alkaline phosphatase (APISO): 67 U/L (ref 35–144)
BUN: 12 mg/dL (ref 7–25)
CO2: 28 mmol/L (ref 20–32)
Calcium: 9.4 mg/dL (ref 8.6–10.3)
Chloride: 105 mmol/L (ref 98–110)
Creat: 0.75 mg/dL (ref 0.70–1.35)
Globulin: 2.2 g/dL (calc) (ref 1.9–3.7)
Glucose, Bld: 102 mg/dL — ABNORMAL HIGH (ref 65–99)
Potassium: 4 mmol/L (ref 3.5–5.3)
Sodium: 142 mmol/L (ref 135–146)
Total Bilirubin: 0.7 mg/dL (ref 0.2–1.2)
Total Protein: 6.8 g/dL (ref 6.1–8.1)
eGFR: 102 mL/min/{1.73_m2} (ref >=60)

## 2022-05-22 LAB — PSA: PSA: 0.8 ng/mL (ref <=4.00)

## 2022-05-22 LAB — LIPID PANEL
Cholesterol: 149 mg/dL (ref <200)
HDL: 59 mg/dL (ref >=40)
LDL Cholesterol (Calc): 67 mg/dL (calc)
Non-HDL Cholesterol (Calc): 90 mg/dL (calc) (ref <130)
Total CHOL/HDL Ratio: 2.5 (calc) (ref <5.0)
Triglycerides: 147 mg/dL (ref <150)

## 2022-05-22 LAB — HEMOGLOBIN A1C
Hgb A1c MFr Bld: 6.3 % of total Hgb — ABNORMAL HIGH (ref <5.7)
Mean Plasma Glucose: 134 mg/dL
eAG (mmol/L): 7.4 mmol/L

## 2022-10-07 ENCOUNTER — Other Ambulatory Visit: Payer: Self-pay | Admitting: Internal Medicine

## 2022-10-07 DIAGNOSIS — I10 Essential (primary) hypertension: Secondary | ICD-10-CM

## 2022-10-07 DIAGNOSIS — K219 Gastro-esophageal reflux disease without esophagitis: Secondary | ICD-10-CM

## 2022-10-08 NOTE — Telephone Encounter (Signed)
Requested by interface surescripts. Last refill doc. 10/02/22. #90 . Too soon for refill. Future visit in 1 month. Requested Prescriptions  Refused Prescriptions Disp Refills   amLODipine (NORVASC) 10 MG tablet [Pharmacy Med Name: AMLODIPINE BESYLATE 10 MG TAB] 90 tablet 1    Sig: TAKE 1 TABLET BY MOUTH EVERY DAY     Cardiovascular: Calcium Channel Blockers 2 Passed - 10/07/2022 11:58 AM      Passed - Last BP in normal range    BP Readings from Last 1 Encounters:  05/21/22 130/70         Passed - Last Heart Rate in normal range    Pulse Readings from Last 1 Encounters:  05/21/22 81         Passed - Valid encounter within last 6 months    Recent Outpatient Visits           4 months ago Primary hypertension   [place] [facility] Margarita Mail, DO   10 months ago Primary hypertension   [place] [facility] Margarita Mail, DO   1 year ago Primary hypertension   [place] [facility] Margarita Mail, DO   1 year ago Primary hypertension   [place] [facility] Margarita Mail, DO   1 year ago Hospital discharge follow-up   [facility] Margarita Mail, DO       Future Appointments             In 1 month Margarita Mail, DO [place] [facility]             famotidine (PEPCID) 20 MG tablet [Pharmacy Med Name: FAMOTIDINE 20 MG TABLET] 90 tablet 1    Sig: TAKE 1 TABLET (20 MG TOTAL) BY MOUTH DAILY AS NEEDED FOR HEARTBURN OR INDIGESTION.     Gastroenterology:  H2 Antagonists Passed - 10/07/2022 11:58 AM      Passed - Valid encounter within last 12 months    Recent Outpatient Visits           4 months ago Primary hypertension   [facility] Margarita Mail, DO   10 months ago Primary hypertension   [facility] Margarita Mail, DO   1 year ago Primary hypertension   [facility] Margarita Mail, DO   1 year ago Primary hypertension   [facility] Margarita Mail, DO   1 year ago Hospital discharge follow-up   [facility] Margarita Mail, DO       Future Appointments             In 1 month Margarita Mail, DO [facility]

## 2022-11-18 NOTE — Progress Notes (Signed)
Established Patient Office Visit  Subjective   Patient ID: Martin Cook, male    DOB: 28-Sep-1960  Age: 62 y.o. MRN: [id number]  Chief Complaint  Patient presents with   Follow-up    HPI  Patient is here for follow up on blood pressure.  Overall he is doing well and reports no changes since his last office visit.  History of Central Retinal Artery Occlusion: -Presented with vision loss in the bottom half of his  left eye suddenly when he woke up on the morning of 05/30/21, seen on exam by Ophthalmology. MRI of the brain negative for acute intracranial process, without evidence of acute or subacute infarct. CTA head and neck 05/25/21 - the non-dominant right vertberal artery becomes occluded immediately beyond the right PICA origin wihtout other large vessel occlusion. Asymmetrically diminished enhancement of the cervical right vertebral artery, common carotid and internal carotid arteries are patent, up to 40-50% stenosis in the proximal right ICA -Evaluated by Neuro who recommended starting on aspirin, Plavix x 3 weeks and then only aspirin. At this point has finished 21 days of duel anticoagulation therapy and is just on aspirin daily and tolerating well. -Echo results 05/26/21: EF 60-65% with normal function of the left ventricle, no valvular abnormalities  -Inflammatory markers negative, LDL 60, A1c 5.9 -Vision continues to remain the same - can see about 40% in the affected eye -Following with Ophthalmology  -Reports no visual changes  Abnormal Hgb: -History of heavy smoking at least 40 years, had been on nicotine patches and had stopped smoking after getting out of the hospital but restarted -Lung cancer screening via low dose CT in 2019 lung RADS 1, however patient is not interested in annual lung cancer screening. Patient continues to smoke and declines lung cancer screening. -Hbg 15.7 5/24  Hypertension: -Medications: Amlodipine 10 mg -Patient is compliant with above medications  and reports no side effects. -Checking BP at home (average): No longer checking regularly -Denies any SOB, CP, LE edema or symptoms of hypotension  HLD: -Medications: Crestor 40 mg -Patient is compliant with above medications and reports no side effects.  -Last lipid panel: Lipid Panel     Component Value Date/Time   CHOL 149 05/21/2022 0920   CHOL 185 06/09/2015 0854   TRIG 147 05/21/2022 0920   HDL 59 05/21/2022 0920   HDL 57 06/09/2015 0854   CHOLHDL 2.5 05/21/2022 0920   VLDL 37 05/26/2021 0502   LDLCALC 67 05/21/2022 0920   LABVLDL 44 (H) 06/09/2015 0854   The 10-year ASCVD risk score (Arnett DK, et al., 2019) is: 14.7%   Values used to calculate the score:     Age: 51 years     Sex: Male     Is Non-Hispanic African American: No     Diabetic: No     Tobacco smoker: Yes     Systolic Blood Pressure: 136 mmHg     Is BP treated: Yes     HDL Cholesterol: 59 mg/dL     Total Cholesterol: 149 mg/dL  GERD: -Currently on Pepcid 20 mg daily, working on avoiding trigger foods   Pre-Diabetes: -A1c 6.3% 5/24 -Not currently on medications  -Still drinking sweet tea regularly   Health maintenance: -Blood work UTD -Never had colon cancer screening, patient states he is not interested despite risks -Lung cancer screening done once in 2019, given the patient is currently smoking he is not interested in continuing with lung cancer screening -Tdap due, continues to decline vaccines  Review of Systems  Constitutional:  Negative for chills and fever.  Respiratory:  Negative for shortness of breath.   Cardiovascular:  Negative for chest pain.  Neurological:  Negative for dizziness.      Objective:     BP 136/76   Pulse 84   Temp 98.2 F (36.8 C)   Resp 18   Ht 5\' 7"  (1.702 m)   Wt 199 lb 9.6 oz (90.5 kg)   SpO2 93%   BMI 31.26 kg/m  BP Readings from Last 3 Encounters:  11/22/22 136/76  05/21/22 130/70  11/20/21 138/78   Wt Readings from Last 3 Encounters:   11/22/22 199 lb 9.6 oz (90.5 kg)  05/21/22 195 lb 12.8 oz (88.8 kg)  11/20/21 197 lb (89.4 kg)      Physical Exam Constitutional:      Appearance: Normal appearance.  HENT:     Head: Normocephalic and atraumatic.  Eyes:     Conjunctiva/sclera: Conjunctivae normal.  Cardiovascular:     Rate and Rhythm: Normal rate and regular rhythm.  Pulmonary:     Effort: Pulmonary effort is normal.     Breath sounds: Normal breath sounds.  Skin:    General: Skin is warm and dry.  Neurological:     General: No focal deficit present.     Mental Status: He is alert. Mental status is at baseline.  Psychiatric:        Mood and Affect: Mood normal.        Behavior: Behavior normal.      No results found for any visits on 11/22/22.   Last CBC Lab Results  Component Value Date   WBC 5.4 05/21/2022   HGB 15.7 05/21/2022   HCT 46.8 05/21/2022   MCV 92.5 05/21/2022   MCH 31.0 05/21/2022   RDW 13.1 05/21/2022   PLT 221 05/21/2022   Last metabolic panel Lab Results  Component Value Date   GLUCOSE 102 (H) 05/21/2022   NA 142 05/21/2022   K 4.0 05/21/2022   CL 105 05/21/2022   CO2 28 05/21/2022   BUN 12 05/21/2022   CREATININE 0.75 05/21/2022   GFRNONAA >60 05/25/2021   CALCIUM 9.4 05/21/2022   PROT 6.8 05/21/2022   ALBUMIN 4.6 05/25/2021   LABGLOB 2.1 06/09/2015   AGRATIO 2.2 06/09/2015   BILITOT 0.7 05/21/2022   ALKPHOS 67 05/25/2021   AST 22 05/21/2022   ALT 23 05/21/2022   ANIONGAP 11 05/25/2021   Last lipids Lab Results  Component Value Date   CHOL 149 05/21/2022   HDL 59 05/21/2022   LDLCALC 67 05/21/2022   TRIG 147 05/21/2022   CHOLHDL 2.5 05/21/2022   Last hemoglobin A1c Lab Results  Component Value Date   HGBA1C 6.3 (H) 05/21/2022   Last thyroid functions Lab Results  Component Value Date   TSH 1.50 02/16/2017   Last vitamin D Lab Results  Component Value Date   VD25OH 35 07/25/2020   Last vitamin B12 and Folate No results found for:  "VITAMINB12", "FOLATE"    The 10-year ASCVD risk score (Arnett DK, et al., 2019) is: 14.7%    Assessment & Plan:   1. Type 2 diabetes mellitus with hyperglycemia, without long-term current use of insulin (HCC): A1c 6.6% today, consistent with new onset diabetes. Patient is not interested in medication at this time - drinking drinking sweet tea regularly despite discussing this in the past. Will hold off on medications, work on diet change and recheck in 3 months. Nutritional  information printed for the patient.   - POCT HgB A1C  2. Primary hypertension: Blood pressure stable here today, no changes made to medications and appropriate refills sent to pharmacy.   - amLODipine (NORVASC) 10 MG tablet; Take 1 tablet (10 mg total) by mouth daily.  Dispense: 90 tablet; Refill: 1  3. Pure hypercholesterolemia: Stable, refill statin.  - rosuvastatin (CRESTOR) 40 MG tablet; Take 1 tablet (40 mg total) by mouth daily.  Dispense: 90 tablet; Refill: 1  4. Gastro-esophageal reflux disease without esophagitis: Stable, refill Pepcid.   - famotidine (PEPCID) 20 MG tablet; Take 1 tablet (20 mg total) by mouth daily as needed for heartburn or indigestion.  Dispense: 90 tablet; Refill: 1   Return in about 3 months (around 02/22/2023) for after 2/4 for a1c.    Margarita Mail, DO

## 2022-11-22 ENCOUNTER — Ambulatory Visit: Payer: BLUE CROSS/BLUE SHIELD | Admitting: Internal Medicine

## 2022-11-22 ENCOUNTER — Encounter: Payer: Self-pay | Admitting: Internal Medicine

## 2022-11-22 VITALS — BP 136/76 | HR 84 | Temp 98.2°F | Resp 18 | Ht 67.0 in | Wt 199.6 lb

## 2022-11-22 DIAGNOSIS — K219 Gastro-esophageal reflux disease without esophagitis: Secondary | ICD-10-CM

## 2022-11-22 DIAGNOSIS — E1165 Type 2 diabetes mellitus with hyperglycemia: Secondary | ICD-10-CM

## 2022-11-22 DIAGNOSIS — I10 Essential (primary) hypertension: Secondary | ICD-10-CM

## 2022-11-22 DIAGNOSIS — E7800 Pure hypercholesterolemia, unspecified: Secondary | ICD-10-CM | POA: Diagnosis not present

## 2022-11-22 LAB — POCT GLYCOSYLATED HEMOGLOBIN (HGB A1C): Hemoglobin A1C: 6.6 % — AB (ref 4.0–5.6)

## 2022-11-22 MED ORDER — FAMOTIDINE 20 MG PO TABS
20.0000 mg | ORAL_TABLET | Freq: Every day | ORAL | 1 refills | Status: DC | PRN
Start: 2022-11-22 — End: 2023-05-26

## 2022-11-22 MED ORDER — AMLODIPINE BESYLATE 10 MG PO TABS: 10.0000 mg | ORAL_TABLET | Freq: Every day | ORAL | 1 refills | Status: DC

## 2022-11-22 MED ORDER — ROSUVASTATIN CALCIUM 40 MG PO TABS: 40.0000 mg | ORAL_TABLET | Freq: Every day | ORAL | 1 refills | Status: DC

## 2022-11-22 NOTE — Patient Instructions (Addendum)
It was great seeing you today!  Plan discussed at today's visit: -A1c 6.6% which is consistent with diabetes - cut out sweet tea -Medications refilled today  Follow up in: 3 months   Take care and let us know if you have any questions or concerns prior to your next visit.  Dr. Caralee Ates  Diabetes Mellitus and Nutrition, Adult When you have diabetes, or diabetes mellitus, it is very important to have healthy eating habits because your blood sugar (glucose) levels are greatly affected by what you eat and drink. Eating healthy foods in the right amounts, at about the same times every day, can help you: Manage your blood glucose. Lower your risk of heart disease. Improve your blood pressure. Reach or maintain a healthy weight. What can affect my meal plan? Every person with diabetes is different, and each person has different needs for a meal plan. Your health care provider may recommend that you work with a dietitian to make a meal plan that is best for you. Your meal plan may vary depending on factors such as: The calories you need. The medicines you take. Your weight. Your blood glucose, blood pressure, and cholesterol levels. Your activity level. Other health conditions you have, such as heart or kidney disease. How do carbohydrates affect me? Carbohydrates, also called carbs, affect your blood glucose level more than any other type of food. Eating carbs raises the amount of glucose in your blood. It is important to know how many carbs you can safely have in each meal. This is different for every person. Your dietitian can help you calculate how many carbs you should have at each meal and for each snack. How does alcohol affect me? Alcohol can cause a decrease in blood glucose (hypoglycemia), especially if you use insulin or take certain diabetes medicines by mouth. Hypoglycemia can be a life-threatening condition. Symptoms of hypoglycemia, such as sleepiness, dizziness, and confusion,  are similar to symptoms of having too much alcohol. Do not drink alcohol if: Your health care provider tells you not to drink. You are pregnant, may be pregnant, or are planning to become pregnant. If you drink alcohol: Limit how much you have to: 0-1 drink a day for women. 0-2 drinks a day for men. Know how much alcohol is in your drink. In the [place] one drink equals one 12 oz bottle of beer (355 mL), one 5 oz glass of wine (148 mL), or one 1 oz glass of hard liquor (44 mL). Keep yourself hydrated with water, diet soda, or unsweetened iced tea. Keep in mind that regular soda, juice, and other mixers may contain a lot of sugar and must be counted as carbs. What are tips for following this plan?  Reading food labels Start by checking the serving size on the Nutrition Facts label of packaged foods and drinks. The number of calories and the amount of carbs, fats, and other nutrients listed on the label are based on one serving of the item. Many items contain more than one serving per package. Check the total grams (g) of carbs in one serving. Check the number of grams of saturated fats and trans fats in one serving. Choose foods that have a low amount or none of these fats. Check the number of milligrams (mg) of salt (sodium) in one serving. Most people should limit total sodium intake to less than 2,300 mg per day. Always check the nutrition information of foods labeled as "low-fat" or "nonfat." These foods may be higher in  added sugar or refined carbs and should be avoided. Talk to your dietitian to identify your daily goals for nutrients listed on the label. Shopping Avoid buying canned, pre-made, or processed foods. These foods tend to be high in fat, sodium, and added sugar. Shop around the outside edge of the grocery store. This is where you will most often find fresh fruits and vegetables, bulk grains, fresh meats, and fresh dairy products. Cooking Use low-heat cooking methods, such as  baking, instead of high-heat cooking methods, such as deep frying. Cook using healthy oils, such as olive, canola, or sunflower oil. Avoid cooking with butter, cream, or high-fat meats. Meal planning Eat meals and snacks regularly, preferably at the same times every day. Avoid going long periods of time without eating. Eat foods that are high in fiber, such as fresh fruits, vegetables, beans, and whole grains. Eat 4-6 oz (112-168 g) of lean protein each day, such as lean meat, chicken, fish, eggs, or tofu. One ounce (oz) (28 g) of lean protein is equal to: 1 oz (28 g) of meat, chicken, or fish. 1 egg.  cup (62 g) of tofu. Eat some foods each day that contain healthy fats, such as avocado, nuts, seeds, and fish. What foods should I eat? Fruits Berries. Apples. Oranges. Peaches. Apricots. Plums. Grapes. Mangoes. Papayas. Pomegranates. Kiwi. Cherries. Vegetables Leafy greens, including lettuce, spinach, kale, chard, collard greens, mustard greens, and cabbage. Beets. Cauliflower. Broccoli. Carrots. Green beans. Tomatoes. Peppers. Onions. Cucumbers. Brussels sprouts. Grains Whole grains, such as whole-wheat or whole-grain bread, crackers, tortillas, cereal, and pasta. Unsweetened oatmeal. Quinoa. Brown or wild rice. Meats and other proteins Seafood. Poultry without skin. Lean cuts of poultry and beef. Tofu. Nuts. Seeds. Dairy Low-fat or fat-free dairy products such as milk, yogurt, and cheese. The items listed above may not be a complete list of foods and beverages you can eat and drink. Contact a dietitian for more information. What foods should I avoid? Fruits Fruits canned with syrup. Vegetables Canned vegetables. Frozen vegetables with butter or cream sauce. Grains Refined white flour and flour products such as bread, pasta, snack foods, and cereals. Avoid all processed foods. Meats and other proteins Fatty cuts of meat. Poultry with skin. Breaded or fried meats. Processed meat. Avoid  saturated fats. Dairy Full-fat yogurt, cheese, or milk. Beverages Sweetened drinks, such as soda or iced tea. The items listed above may not be a complete list of foods and beverages you should avoid. Contact a dietitian for more information. Questions to ask a health care provider Do I need to meet with a certified diabetes care and education specialist? Do I need to meet with a dietitian? What number can I call if I have questions? When are the best times to check my blood glucose? Where to find more information: American Diabetes Association: diabetes.org Academy of Nutrition and Dietetics: eatright[employer] of Diabetes and Digestive and Kidney Diseases: StageSync.si Association of Diabetes Care & Education Specialists: diabeteseducator.org Summary It is important to have healthy eating habits because your blood sugar (glucose) levels are greatly affected by what you eat and drink. It is important to use alcohol carefully. A healthy meal plan will help you manage your blood glucose and lower your risk of heart disease. Your health care provider may recommend that you work with a dietitian to make a meal plan that is best for you. This information is not intended to replace advice given to you by your health care provider. Make sure you discuss any  questions you have with your health care provider. Document Revised: 08/08/2019 Document Reviewed: 08/08/2019 Elsevier Patient Education  2024 [employer].

## 2023-02-23 ENCOUNTER — Ambulatory Visit: Payer: Commercial Managed Care - PPO | Admitting: Internal Medicine

## 2023-02-23 ENCOUNTER — Other Ambulatory Visit: Payer: Self-pay

## 2023-02-23 ENCOUNTER — Encounter: Payer: Self-pay | Admitting: Internal Medicine

## 2023-02-23 VITALS — BP 124/70 | HR 85 | Temp 97.7°F | Resp 16 | Ht 67.0 in | Wt 198.8 lb

## 2023-02-23 DIAGNOSIS — E7800 Pure hypercholesterolemia, unspecified: Secondary | ICD-10-CM

## 2023-02-23 DIAGNOSIS — N529 Male erectile dysfunction, unspecified: Secondary | ICD-10-CM

## 2023-02-23 DIAGNOSIS — E1165 Type 2 diabetes mellitus with hyperglycemia: Secondary | ICD-10-CM | POA: Insufficient documentation

## 2023-02-23 DIAGNOSIS — J439 Emphysema, unspecified: Secondary | ICD-10-CM | POA: Insufficient documentation

## 2023-02-23 DIAGNOSIS — Z720 Tobacco use: Secondary | ICD-10-CM

## 2023-02-23 DIAGNOSIS — I10 Essential (primary) hypertension: Secondary | ICD-10-CM | POA: Diagnosis not present

## 2023-02-23 LAB — POCT GLYCOSYLATED HEMOGLOBIN (HGB A1C): Hemoglobin A1C: 6.3 % — AB (ref 4.0–5.6)

## 2023-02-23 MED ORDER — NICOTINE 14 MG/24HR TD PT24: 14.0000 mg | MEDICATED_PATCH | Freq: Every day | TRANSDERMAL | 0 refills | Status: AC

## 2023-02-23 MED ORDER — SILDENAFIL CITRATE 50 MG PO TABS: 50.0000 mg | ORAL_TABLET | Freq: Every day | ORAL | 0 refills | Status: DC | PRN

## 2023-02-23 NOTE — Assessment & Plan Note (Signed)
 Trying to quit, prescribed nicotine  patches. Declines vaccines and lung cancer screening.

## 2023-02-23 NOTE — Assessment & Plan Note (Signed)
 Complaining of SOB with activity although he is not interested in inhalers. Sample of Trelegy given today, discussed rinsing mouth after use of inhaler. Will prescribe if he likes it. Does not want rescue inhaler. Continues to smoke but has better insight about quitting, will prescribe nicotine  patches again, can use GoodRx as well.

## 2023-02-23 NOTE — Assessment & Plan Note (Signed)
 Wanting something for ED, will prescribe Viagra  50 mg PRN, discussed using GoodRx to get better prices, card provided.

## 2023-02-23 NOTE — Progress Notes (Signed)
 Established Patient Office Visit  Subjective   Patient ID: Martin Cook, male    DOB: 1960/08/27  Age: 63 y.o. MRN: [id number]  Chief Complaint  Patient presents with   Medical Management of Chronic Issues    3 month recheck    HPI  Patient is here for follow up on chronic medical conditions.   History of Central Retinal Artery Occlusion: -Presented with vision loss in the bottom half of his  left eye suddenly when he woke up on the morning of 05/30/21, seen on exam by Ophthalmology. MRI of the brain negative for acute intracranial process, without evidence of acute or subacute infarct. CTA head and neck 05/25/21 - the non-dominant right vertberal artery becomes occluded immediately beyond the right PICA origin wihtout other large vessel occlusion. Asymmetrically diminished enhancement of the cervical right vertebral artery, common carotid and internal carotid arteries are patent, up to 40-50% stenosis in the proximal right ICA -Evaluated by Neuro who recommended starting on aspirin , Plavix  x 3 weeks and then only aspirin . At this point has finished 21 days of duel anticoagulation therapy and is just on aspirin  daily and tolerating well. -Echo results 05/26/21: EF 60-65% with normal function of the left ventricle, no valvular abnormalities  -Inflammatory markers negative, LDL 60, A1c 5.9 -Vision continues to remain the same - can see about 40% in the affected eye -Following with Ophthalmology  -Reports no visual changes  Abnormal Hgb: -History of heavy smoking at least 40 years, trying to decrease but going slowly. Tried to prescribe nicotine  patches before but insurance would not cover -Lung cancer screening via low dose CT in 2019 lung RADS 1, however patient is not interested in annual lung cancer screening. Patient continues to smoke and declines lung cancer screening. -Hbg 15.7 5/24  Hypertension: -Medications: Amlodipine  10 mg -Patient is compliant with above medications and  reports no side effects. -Checking BP at home (average): No longer checking regularly -Denies any SOB, CP, LE edema or symptoms of hypotension  HLD: -Medications: Crestor  40 mg -Patient is compliant with above medications and reports no side effects.  -Last lipid panel: Lipid Panel     Component Value Date/Time   CHOL 149 05/21/2022 0920   CHOL 185 06/09/2015 0854   TRIG 147 05/21/2022 0920   HDL 59 05/21/2022 0920   HDL 57 06/09/2015 0854   CHOLHDL 2.5 05/21/2022 0920   VLDL 37 05/26/2021 0502   LDLCALC 67 05/21/2022 0920   LABVLDL 44 (H) 06/09/2015 0854   GERD: -Currently on Pepcid  20 mg daily, working on avoiding trigger foods   Diabetes, Type 2: -Last A1c 6.6% 11/24 -Medications: Nothing -Diet: working on decreasing sugar and sweet tea -Exercise: None -Eye exam: UTD -Foot exam: UTD -Microalbumin: Due -Statin: yes -PNA vaccine: Declines vaccine -Denies symptoms of hypoglycemia, polyuria, polydipsia, numbness extremities, foot ulcers/trauma.   Health maintenance: -Blood work UTD -Never had colon cancer screening, patient states he is not interested despite risks -Lung cancer screening done once in 2019, given the patient is currently smoking he is not interested in continuing with lung cancer screening -Tdap due, continues to decline vaccines  Patient Active Problem List   Diagnosis Date Noted   Pulmonary emphysema, unspecified emphysema type (HCC) 02/23/2023   Pure hypercholesterolemia 02/23/2023   Type 2 diabetes mellitus with hyperglycemia, without long-term current use of insulin (HCC) 02/23/2023   Central retinal artery occlusion 05/25/2021   Obesity (BMI 30-39.9) 05/25/2021   Abnormal hemoglobin (HCC) 05/25/2021   Alcohol use  11/29/2019   Overweight (BMI 25.0-29.9) 06/29/2019   Mixed hyperlipidemia 06/29/2019   Aortic atherosclerosis (HCC) 05/22/2018   Vitamin D  deficiency 05/22/2018   Prediabetes 06/21/2017   ED (erectile dysfunction) of organic origin  03/29/2017   Wheezing on auscultation 04/07/2016   Cough 04/07/2016   Tobacco abuse 12/10/2014   Hypertension 09/09/2014   GERD (gastroesophageal reflux disease) 09/09/2014   Past Medical History:  Diagnosis Date   GERD (gastroesophageal reflux disease)    Hyperlipidemia    Hypertension    No past surgical history on file. Social History   Tobacco Use   Smoking status: Every Day    Current packs/day: 1.00    Average packs/day: 1 pack/day for 43.0 years (43.0 ttl pk-yrs)    Types: Cigarettes   Smokeless tobacco: Never  Vaping Use   Vaping status: Never Used  Substance Use Topics   Alcohol use: No    Alcohol/week: 0.0 standard drinks of alcohol    Comment: occasional   Drug use: No   Social History   Socioeconomic History   Marital status: Married    Spouse name: Not on file   Number of children: Not on file   Years of education: Not on file   Highest education level: Not on file  Occupational History   Not on file  Tobacco Use   Smoking status: Every Day    Current packs/day: 1.00    Average packs/day: 1 pack/day for 43.0 years (43.0 ttl pk-yrs)    Types: Cigarettes   Smokeless tobacco: Never  Vaping Use   Vaping status: Never Used  Substance and Sexual Activity   Alcohol use: No    Alcohol/week: 0.0 standard drinks of alcohol    Comment: occasional   Drug use: No   Sexual activity: Yes  Other Topics Concern   Not on file  Social History Narrative   Not on file   Social Drivers of Health   Financial Resource Strain: Not on file  Food Insecurity: Not on file  Transportation Needs: Not on file  Physical Activity: Not on file  Stress: Not on file  Social Connections: Not on file  Intimate Partner Violence: Not on file   Family Status  Relation Name Status   Mother  Deceased   Father  Deceased   Sister  Alive   Brother  Deceased   Brother  Deceased   Daughter  Alive   Son  Alive   MGM  Deceased   MGF  Deceased   PGM  Deceased   PGF  Deceased   No partnership data on file   Family History  Problem Relation Age of Onset   Stroke Mother    Hyperlipidemia Mother    Stroke Father    Deep vein thrombosis Father    Hyperlipidemia Father    Hyperlipidemia Sister    Hyperlipidemia Brother    Cancer Brother 72       bone   Cancer Brother 40       liver   Hyperlipidemia Brother    Stroke Maternal Grandfather        Pt is unsure    No Known Allergies    Review of Systems  Constitutional:  Negative for chills and fever.  Respiratory:  Positive for cough, shortness of breath and wheezing.   Cardiovascular:  Negative for chest pain.      Objective:     BP 124/70 (Cuff Size: Large)   Pulse 85   Temp 97.7 F (36.5  C) (Oral)   Resp 16   Ht 5' 7 (1.702 m)   Wt 198 lb 12.8 oz (90.2 kg)   SpO2 94%   BMI 31.14 kg/m  BP Readings from Last 3 Encounters:  02/23/23 124/70  11/22/22 136/76  05/21/22 130/70   Wt Readings from Last 3 Encounters:  02/23/23 198 lb 12.8 oz (90.2 kg)  11/22/22 199 lb 9.6 oz (90.5 kg)  05/21/22 195 lb 12.8 oz (88.8 kg)      Physical Exam Constitutional:      Appearance: Normal appearance.  HENT:     Head: Normocephalic and atraumatic.  Eyes:     Conjunctiva/sclera: Conjunctivae normal.  Cardiovascular:     Rate and Rhythm: Normal rate and regular rhythm.  Pulmonary:     Effort: Pulmonary effort is normal.     Breath sounds: Wheezing present.     Comments: Inspiratory wheezing throughout  Musculoskeletal:     Right lower leg: No edema.     Left lower leg: No edema.  Skin:    General: Skin is warm and dry.  Neurological:     General: No focal deficit present.     Mental Status: He is alert. Mental status is at baseline.  Psychiatric:        Mood and Affect: Mood normal.        Behavior: Behavior normal.      Results for orders placed or performed in visit on 02/23/23  POCT HgB A1C  Result Value Ref Range   Hemoglobin A1C 6.3 (A) 4.0 - 5.6 %   HbA1c POC (<> result,  manual entry)     HbA1c, POC (prediabetic range)     HbA1c, POC (controlled diabetic range)      Last CBC Lab Results  Component Value Date   WBC 5.4 05/21/2022   HGB 15.7 05/21/2022   HCT 46.8 05/21/2022   MCV 92.5 05/21/2022   MCH 31.0 05/21/2022   RDW 13.1 05/21/2022   PLT 221 05/21/2022   Last metabolic panel Lab Results  Component Value Date   GLUCOSE 102 (H) 05/21/2022   NA 142 05/21/2022   K 4.0 05/21/2022   CL 105 05/21/2022   CO2 28 05/21/2022   BUN 12 05/21/2022   CREATININE 0.75 05/21/2022   EGFR 102 05/21/2022   CALCIUM  9.4 05/21/2022   PROT 6.8 05/21/2022   ALBUMIN 4.6 05/25/2021   LABGLOB 2.1 06/09/2015   AGRATIO 2.2 06/09/2015   BILITOT 0.7 05/21/2022   ALKPHOS 67 05/25/2021   AST 22 05/21/2022   ALT 23 05/21/2022   ANIONGAP 11 05/25/2021   Last lipids Lab Results  Component Value Date   CHOL 149 05/21/2022   HDL 59 05/21/2022   LDLCALC 67 05/21/2022   TRIG 147 05/21/2022   CHOLHDL 2.5 05/21/2022   Last hemoglobin A1c Lab Results  Component Value Date   HGBA1C 6.3 (A) 02/23/2023   Last thyroid  functions Lab Results  Component Value Date   TSH 1.50 02/16/2017   Last vitamin D  Lab Results  Component Value Date   VD25OH 35 07/25/2020   Last vitamin B12 and Folate No results found for: VITAMINB12, FOLATE    The 10-year ASCVD risk score (Arnett DK, et al., 2019) is: 23%    Assessment & Plan:  Primary hypertension Assessment & Plan: Blood pressure stable here today, no changes made to medications.    Pure hypercholesterolemia Assessment & Plan: Plan to recheck fasting labs at follow up, continue statin.   Type  2 diabetes mellitus with hyperglycemia, without long-term current use of insulin (HCC) Assessment & Plan: A1c improved today to 6.3%, microalbumin done as well. Not on medications, continue to monitor.   Orders: -     POCT glycosylated hemoglobin (Hb A1C) -     Microalbumin / creatinine urine ratio  Pulmonary  emphysema, unspecified emphysema type (HCC) Assessment & Plan: Complaining of SOB with activity although he is not interested in inhalers. Sample of Trelegy given today, discussed rinsing mouth after use of inhaler. Will prescribe if he likes it. Does not want rescue inhaler. Continues to smoke but has better insight about quitting, will prescribe nicotine  patches again, can use GoodRx as well.    Tobacco abuse Assessment & Plan: Trying to quit, prescribed nicotine  patches. Declines vaccines and lung cancer screening.  Orders: -     Nicotine ; Place 1 patch (14 mg total) onto the skin daily.  Dispense: 28 patch; Refill: 0  ED (erectile dysfunction) of organic origin Assessment & Plan: Wanting something for ED, will prescribe Viagra  50 mg PRN, discussed using GoodRx to get better prices, card provided.   Orders: -     Sildenafil  Citrate; Take 1 tablet (50 mg total) by mouth daily as needed for erectile dysfunction.  Dispense: 20 tablet; Refill: 0     Return in about 3 months (around 05/23/2023) for after 5/5 for a1c.    Sharyle Fischer, DO

## 2023-02-23 NOTE — Assessment & Plan Note (Signed)
 Blood pressure stable here today, no changes made to medications.

## 2023-02-23 NOTE — Assessment & Plan Note (Signed)
 Plan to recheck fasting labs at follow up, continue statin.

## 2023-02-23 NOTE — Assessment & Plan Note (Signed)
 A1c improved today to 6.3%, microalbumin done as well. Not on medications, continue to monitor.

## 2023-02-24 LAB — MICROALBUMIN / CREATININE URINE RATIO
Creatinine, Urine: 237 mg/dL (ref 20–320)
Microalb Creat Ratio: 71 mg/g{creat} — ABNORMAL HIGH (ref <30)
Microalb, Ur: 16.9 mg/dL

## 2023-05-26 ENCOUNTER — Ambulatory Visit: Payer: Commercial Managed Care - PPO | Admitting: Internal Medicine

## 2023-05-26 ENCOUNTER — Other Ambulatory Visit: Payer: Self-pay

## 2023-05-26 ENCOUNTER — Encounter: Payer: Self-pay | Admitting: Internal Medicine

## 2023-05-26 VITALS — BP 126/72 | HR 88 | Temp 97.9°F | Resp 16 | Ht 67.0 in | Wt 199.4 lb

## 2023-05-26 DIAGNOSIS — E1165 Type 2 diabetes mellitus with hyperglycemia: Secondary | ICD-10-CM

## 2023-05-26 DIAGNOSIS — I10 Essential (primary) hypertension: Secondary | ICD-10-CM | POA: Diagnosis not present

## 2023-05-26 DIAGNOSIS — E7800 Pure hypercholesterolemia, unspecified: Secondary | ICD-10-CM | POA: Diagnosis not present

## 2023-05-26 DIAGNOSIS — Z125 Encounter for screening for malignant neoplasm of prostate: Secondary | ICD-10-CM

## 2023-05-26 DIAGNOSIS — K219 Gastro-esophageal reflux disease without esophagitis: Secondary | ICD-10-CM | POA: Diagnosis not present

## 2023-05-26 MED ORDER — ROSUVASTATIN CALCIUM 40 MG PO TABS: 40.0000 mg | ORAL_TABLET | Freq: Every day | ORAL | 1 refills | Status: DC

## 2023-05-26 MED ORDER — AMLODIPINE BESYLATE 10 MG PO TABS: 10.0000 mg | ORAL_TABLET | Freq: Every day | ORAL | 1 refills | Status: DC

## 2023-05-26 MED ORDER — FAMOTIDINE 20 MG PO TABS: 20.0000 mg | ORAL_TABLET | Freq: Every day | ORAL | 1 refills | Status: DC | PRN

## 2023-05-26 NOTE — Progress Notes (Signed)
 Established Patient Office Visit  Subjective   Patient ID: Martin Cook, male    DOB: January 24, 1960  Age: 63 y.o. MRN: [id number]  Chief Complaint  Patient presents with   Medical Management of Chronic Issues    HPI  Patient is here for follow up on chronic medical conditions.   History of Central Retinal Artery Occlusion: -Presented with vision loss in the bottom half of his  left eye suddenly when he woke up on the morning of 05/30/21, seen on exam by Ophthalmology. MRI of the brain negative for acute intracranial process, without evidence of acute or subacute infarct. CTA head and neck 05/25/21 - the non-dominant right vertberal artery becomes occluded immediately beyond the right PICA origin wihtout other large vessel occlusion. Asymmetrically diminished enhancement of the cervical right vertebral artery, common carotid and internal carotid arteries are patent, up to 40-50% stenosis in the proximal right ICA -Evaluated by Neuro who recommended starting on aspirin , Plavix  x 3 weeks and then only aspirin . At this point has finished 21 days of duel anticoagulation therapy and is just on aspirin  daily and tolerating well. -Echo results 05/26/21: EF 60-65% with normal function of the left ventricle, no valvular abnormalities  -Inflammatory markers negative, LDL 60, A1c 5.9 -Vision continues to remain the same - can see about 40% in the affected eye -Following with Ophthalmology  -Reports no visual changes  Abnormal Hgb: -History of heavy smoking at least 40 years, trying to decrease but going slowly. Tried to prescribe nicotine  patches before but insurance would not cover -Lung cancer screening via low dose CT in 2019 lung RADS 1, however patient is not interested in annual lung cancer screening. Patient continues to smoke and declines lung cancer screening.  Hypertension: -Medications: Amlodipine  10 mg -Patient is compliant with above medications and reports no side effects. -Checking BP at  home (average): No longer checking regularly -Denies any SOB, CP, LE edema or symptoms of hypotension  HLD: -Medications: Crestor  40 mg -Patient is compliant with above medications and reports no side effects.  -Last lipid panel: Lipid Panel     Component Value Date/Time   CHOL 149 05/21/2022 0920   CHOL 185 06/09/2015 0854   TRIG 147 05/21/2022 0920   HDL 59 05/21/2022 0920   HDL 57 06/09/2015 0854   CHOLHDL 2.5 05/21/2022 0920   VLDL 37 05/26/2021 0502   LDLCALC 67 05/21/2022 0920   LABVLDL 44 (H) 06/09/2015 0854   GERD: -Currently on Pepcid  20 mg daily, working on avoiding trigger foods   Diabetes, Type 2: -Last A1c 6.3% 2/25 -Medications: Nothing -Diet: working on decreasing sugar and sweet tea -Exercise: None -Eye exam: UTD -Foot exam: Due today -Microalbumin: UTD 2/25 -Statin: yes -PNA vaccine: Declines vaccine -Denies symptoms of hypoglycemia, polyuria, polydipsia, numbness extremities, foot ulcers/trauma.   Health maintenance: -Blood work due -Never had colon cancer screening, patient states he is not interested despite risks -Lung cancer screening done once in 2019, given the patient is currently smoking he is not interested in continuing with lung cancer screening -Continues to decline vaccines  Patient Active Problem List   Diagnosis Date Noted   Pulmonary emphysema, unspecified emphysema type (HCC) 02/23/2023   Pure hypercholesterolemia 02/23/2023   Type 2 diabetes mellitus with hyperglycemia, without long-term current use of insulin (HCC) 02/23/2023   Central retinal artery occlusion 05/25/2021   Obesity (BMI 30-39.9) 05/25/2021   Abnormal hemoglobin (HCC) 05/25/2021   Alcohol use 11/29/2019   Overweight (BMI 25.0-29.9) 06/29/2019  Mixed hyperlipidemia 06/29/2019   Aortic atherosclerosis (HCC) 05/22/2018   Vitamin D  deficiency 05/22/2018   Prediabetes 06/21/2017   ED (erectile dysfunction) of organic origin 03/29/2017   Wheezing on auscultation  04/07/2016   Cough 04/07/2016   Tobacco abuse 12/10/2014   Hypertension 09/09/2014   GERD (gastroesophageal reflux disease) 09/09/2014   Past Medical History:  Diagnosis Date   GERD (gastroesophageal reflux disease)    Hyperlipidemia    Hypertension    No past surgical history on file. Social History   Tobacco Use   Smoking status: Every Day    Current packs/day: 1.00    Average packs/day: 1 pack/day for 43.0 years (43.0 ttl pk-yrs)    Types: Cigarettes   Smokeless tobacco: Never  Vaping Use   Vaping status: Never Used  Substance Use Topics   Alcohol use: No    Alcohol/week: 0.0 standard drinks of alcohol    Comment: occasional   Drug use: No   Social History   Socioeconomic History   Marital status: Married    Spouse name: Not on file   Number of children: Not on file   Years of education: Not on file   Highest education level: Not on file  Occupational History   Not on file  Tobacco Use   Smoking status: Every Day    Current packs/day: 1.00    Average packs/day: 1 pack/day for 43.0 years (43.0 ttl pk-yrs)    Types: Cigarettes   Smokeless tobacco: Never  Vaping Use   Vaping status: Never Used  Substance and Sexual Activity   Alcohol use: No    Alcohol/week: 0.0 standard drinks of alcohol    Comment: occasional   Drug use: No   Sexual activity: Yes  Other Topics Concern   Not on file  Social History Narrative   Not on file   Social Drivers of Health   Financial Resource Strain: Not on file  Food Insecurity: Not on file  Transportation Needs: Not on file  Physical Activity: Not on file  Stress: Not on file  Social Connections: Not on file  Intimate Partner Violence: Not on file   Family Status  Relation Name Status   Mother  Deceased   Father  Deceased   Sister  Alive   Brother  Deceased   Brother  Deceased   Daughter  Alive   Son  Alive   MGM  Deceased   MGF  Deceased   PGM  Deceased   PGF  Deceased  No partnership data on file    Family History  Problem Relation Age of Onset   Stroke Mother    Hyperlipidemia Mother    Stroke Father    Deep vein thrombosis Father    Hyperlipidemia Father    Hyperlipidemia Sister    Hyperlipidemia Brother    Cancer Brother 55       bone   Cancer Brother 7       liver   Hyperlipidemia Brother    Stroke Maternal Grandfather        Pt is unsure    No Known Allergies    Review of Systems  All other systems reviewed and are negative.     Objective:     BP 126/72 (Cuff Size: Large)   Pulse 88   Temp 97.9 F (36.6 C) (Oral)   Resp 16   Ht 5\' 7"  (1.702 m)   Wt 199 lb 6.4 oz (90.4 kg)   SpO2 93%   BMI  31.23 kg/m  BP Readings from Last 3 Encounters:  05/26/23 126/72  02/23/23 124/70  11/22/22 136/76   Wt Readings from Last 3 Encounters:  05/26/23 199 lb 6.4 oz (90.4 kg)  02/23/23 198 lb 12.8 oz (90.2 kg)  11/22/22 199 lb 9.6 oz (90.5 kg)      Physical Exam Constitutional:      Appearance: Normal appearance.  HENT:     Head: Normocephalic and atraumatic.  Eyes:     Conjunctiva/sclera: Conjunctivae normal.  Cardiovascular:     Rate and Rhythm: Normal rate and regular rhythm.     Pulses:          Dorsalis pedis pulses are 2+ on the right side and 2+ on the left side.  Pulmonary:     Effort: Pulmonary effort is normal.     Breath sounds: Normal breath sounds.  Musculoskeletal:     Right lower leg: No edema.     Left lower leg: No edema.     Right foot: Normal range of motion. No deformity, bunion, Charcot foot, foot drop or prominent metatarsal heads.     Left foot: Normal range of motion. No deformity, bunion, Charcot foot, foot drop or prominent metatarsal heads.  Feet:     Right foot:     Protective Sensation: 6 sites tested.  6 sites sensed.     Skin integrity: Skin integrity normal.     Toenail Condition: Right toenails are normal.     Left foot:     Protective Sensation: 6 sites tested.  6 sites sensed.     Skin integrity: Skin integrity  normal.     Toenail Condition: Left toenails are normal.  Skin:    General: Skin is warm and dry.  Neurological:     General: No focal deficit present.     Mental Status: He is alert. Mental status is at baseline.  Psychiatric:        Mood and Affect: Mood normal.        Behavior: Behavior normal.      No results found for any visits on 05/26/23.   Last CBC Lab Results  Component Value Date   WBC 5.4 05/21/2022   HGB 15.7 05/21/2022   HCT 46.8 05/21/2022   MCV 92.5 05/21/2022   MCH 31.0 05/21/2022   RDW 13.1 05/21/2022   PLT 221 05/21/2022   Last metabolic panel Lab Results  Component Value Date   GLUCOSE 102 (H) 05/21/2022   NA 142 05/21/2022   K 4.0 05/21/2022   CL 105 05/21/2022   CO2 28 05/21/2022   BUN 12 05/21/2022   CREATININE 0.75 05/21/2022   EGFR 102 05/21/2022   CALCIUM  9.4 05/21/2022   PROT 6.8 05/21/2022   ALBUMIN 4.6 05/25/2021   LABGLOB 2.1 06/09/2015   AGRATIO 2.2 06/09/2015   BILITOT 0.7 05/21/2022   ALKPHOS 67 05/25/2021   AST 22 05/21/2022   ALT 23 05/21/2022   ANIONGAP 11 05/25/2021   Last lipids Lab Results  Component Value Date   CHOL 149 05/21/2022   HDL 59 05/21/2022   LDLCALC 67 05/21/2022   TRIG 147 05/21/2022   CHOLHDL 2.5 05/21/2022   Last hemoglobin A1c Lab Results  Component Value Date   HGBA1C 6.3 (A) 02/23/2023   Last thyroid  functions Lab Results  Component Value Date   TSH 1.50 02/16/2017   Last vitamin D  Lab Results  Component Value Date   VD25OH 35 07/25/2020   Last vitamin B12 and Folate  No results found for: "VITAMINB12", "FOLATE"    The 10-year ASCVD risk score (Arnett DK, et al., 2019) is: 23.6%    Assessment & Plan:   Assessment & Plan Alcohol use Discussed benefits of reduction on breathing and overall health. - Encourage reduction in alcohol consumption.  Hypertension Blood pressure well-controlled at 126/72 mmHg. - Continue current antihypertensive medications. Labs ordered.  HLD -  Recheck lipid panel.  - Continue and refill statin.  Diabetes mellitus Discussed importance of lifestyle modifications for glycemic control. - Perform A1c test. - Perform annual foot exam.  GERD - Symptoms stable, refill Pepcid .   Wellness Visit Blood pressure well-controlled. Discussed lifestyle modifications and retirement. Declined pneumonia vaccine. - Refill current medications. - Perform blood work including hemoglobin, white blood cells, platelets, kidney and liver function, electrolytes, cholesterol, A1c, and prostate cancer screening. - Perform annual foot exam. - Encourage weight management through dietary changes and reduced alcohol intake.  - CBC w/Diff/Platelet - COMPLETE METABOLIC PANEL WITHOUT GFR - amLODipine  (NORVASC ) 10 MG tablet; Take 1 tablet (10 mg total) by mouth daily.  Dispense: 90 tablet; Refill: 1 - Lipid Profile - rosuvastatin  (CRESTOR ) 40 MG tablet; Take 1 tablet (40 mg total) by mouth daily.  Dispense: 90 tablet; Refill: 1 - HgB A1c - HM Diabetes Foot Exam - famotidine  (PEPCID ) 20 MG tablet; Take 1 tablet (20 mg total) by mouth daily as needed for heartburn or indigestion.  Dispense: 90 tablet; Refill: 1 - PSA     Return in about 6 months (around 11/26/2023).    Rockney Cid, DO

## 2023-05-27 LAB — COMPLETE METABOLIC PANEL WITHOUT GFR
AG Ratio: 2.2 (calc) (ref 1.0–2.5)
ALT: 17 U/L (ref 9–46)
AST: 19 U/L (ref 10–35)
Albumin: 4.7 g/dL (ref 3.6–5.1)
Alkaline phosphatase (APISO): 60 U/L (ref 35–144)
BUN: 14 mg/dL (ref 7–25)
CO2: 30 mmol/L (ref 20–32)
Calcium: 9.7 mg/dL (ref 8.6–10.3)
Chloride: 106 mmol/L (ref 98–110)
Creat: 0.83 mg/dL (ref 0.70–1.35)
Globulin: 2.1 g/dL (ref 1.9–3.7)
Glucose, Bld: 106 mg/dL — ABNORMAL HIGH (ref 65–99)
Potassium: 3.8 mmol/L (ref 3.5–5.3)
Sodium: 143 mmol/L (ref 135–146)
Total Bilirubin: 0.9 mg/dL (ref 0.2–1.2)
Total Protein: 6.8 g/dL (ref 6.1–8.1)

## 2023-05-27 LAB — HEMOGLOBIN A1C
Hgb A1c MFr Bld: 6.4 % — ABNORMAL HIGH (ref <5.7)
Mean Plasma Glucose: 137 mg/dL
eAG (mmol/L): 7.6 mmol/L

## 2023-05-27 LAB — LIPID PANEL
Cholesterol: 165 mg/dL (ref <200)
HDL: 64 mg/dL (ref >=40)
LDL Cholesterol (Calc): 76 mg/dL
Non-HDL Cholesterol (Calc): 101 mg/dL (ref <130)
Total CHOL/HDL Ratio: 2.6 (calc) (ref <5.0)
Triglycerides: 156 mg/dL — ABNORMAL HIGH (ref <150)

## 2023-05-27 LAB — CBC WITH DIFFERENTIAL/PLATELET
Absolute Lymphocytes: 1415 {cells}/uL (ref 850–3900)
Absolute Monocytes: 482 {cells}/uL (ref 200–950)
Basophils Absolute: 49 {cells}/uL (ref 0–200)
Basophils Relative: 0.8 %
Eosinophils Absolute: 43 {cells}/uL (ref 15–500)
Eosinophils Relative: 0.7 %
HCT: 48.4 % (ref 38.5–50.0)
Hemoglobin: 16.4 g/dL (ref 13.2–17.1)
MCH: 31.7 pg (ref 27.0–33.0)
MCHC: 33.9 g/dL (ref 32.0–36.0)
MCV: 93.4 fL (ref 80.0–100.0)
MPV: 10.5 fL (ref 7.5–12.5)
Monocytes Relative: 7.9 %
Neutro Abs: 4111 {cells}/uL (ref 1500–7800)
Neutrophils Relative %: 67.4 %
Platelets: 183 10*3/uL (ref 140–400)
RBC: 5.18 10*6/uL (ref 4.20–5.80)
RDW: 13.5 % (ref 11.0–15.0)
Total Lymphocyte: 23.2 %
WBC: 6.1 10*3/uL (ref 3.8–10.8)

## 2023-05-27 LAB — PSA: PSA: 1.43 ng/mL (ref <=4.00)

## 2023-10-07 ENCOUNTER — Encounter: Payer: Self-pay | Admitting: Internal Medicine

## 2023-10-07 ENCOUNTER — Other Ambulatory Visit: Payer: Self-pay

## 2023-10-07 ENCOUNTER — Ambulatory Visit: Admitting: Internal Medicine

## 2023-10-07 VITALS — BP 130/80 | HR 98 | Temp 98.2°F | Resp 18 | Ht 67.0 in | Wt 195.3 lb

## 2023-10-07 DIAGNOSIS — J441 Chronic obstructive pulmonary disease with (acute) exacerbation: Secondary | ICD-10-CM | POA: Diagnosis not present

## 2023-10-07 DIAGNOSIS — N529 Male erectile dysfunction, unspecified: Secondary | ICD-10-CM

## 2023-10-07 MED ORDER — ALBUTEROL SULFATE HFA 108 (90 BASE) MCG/ACT IN AERS: 2.0000 | INHALATION_SPRAY | Freq: Four times a day (QID) | RESPIRATORY_TRACT | 2 refills | Status: AC | PRN

## 2023-10-07 MED ORDER — DOXYCYCLINE HYCLATE 100 MG PO TABS: 100.0000 mg | ORAL_TABLET | Freq: Two times a day (BID) | ORAL | 0 refills | Status: AC

## 2023-10-07 MED ORDER — SILDENAFIL CITRATE 100 MG PO TABS: 50.0000 mg | ORAL_TABLET | Freq: Every day | ORAL | 11 refills | Status: AC | PRN

## 2023-10-07 MED ORDER — PREDNISONE 20 MG PO TABS: 40.0000 mg | ORAL_TABLET | Freq: Every day | ORAL | 0 refills | Status: AC

## 2023-10-07 NOTE — Progress Notes (Signed)
 Acute Office Visit  Subjective:     Patient ID: Martin Cook, male    DOB: 01/18/1961, 63 y.o.   MRN: [id number]  Chief Complaint  Patient presents with   Sinus Problem    For 10 days    Sinus Problem Associated symptoms include coughing and shortness of breath. Pertinent negatives include no chills, congestion or sore throat.   Patient is in today for sinus pain x 7 days.   Discussed the use of AI scribe software for clinical note transcription with the patient, who gave verbal consent to proceed.  History of Present Illness Martin Cook is a 63 year old male with COPD who presents with worsening shortness of breath and cough.  He has experienced shortness of breath and a dry cough for the past week, with symptoms progressively worsening. The shortness of breath is exacerbated by exertion, such as climbing stairs. Increased wheezing is noted, worse than his usual baseline. His wife has similar symptoms. There is no fever or sinus pain. He used leftover antibiotics from his wife without relief and has not been using over-the-counter medications or inhalers regularly, though he has one at home.  He is on a diet to lose weight, having lost five pounds, and has reduced alcohol consumption and fast food intake while increasing fruit consumption. He notes improvements in symptoms like ankle swelling. His family encouraged him to seek medical attention due to his symptoms. He is committed to maintaining his health and making lifestyle changes.   Review of Systems  Constitutional:  Negative for chills and fever.  HENT:  Negative for congestion and sore throat.   Respiratory:  Positive for cough, sputum production, shortness of breath and wheezing.         Objective:    BP 130/80 (Cuff Size: Large)   Pulse 98   Temp 98.2 F (36.8 C) (Oral)   Resp 18   Ht 5' 7 (1.702 m)   Wt 195 lb 4.8 oz (88.6 kg)   SpO2 98%   BMI 30.59 kg/m  BP Readings from Last 3 Encounters:   10/07/23 130/80  05/26/23 126/72  02/23/23 124/70   Wt Readings from Last 3 Encounters:  10/07/23 195 lb 4.8 oz (88.6 kg)  05/26/23 199 lb 6.4 oz (90.4 kg)  02/23/23 198 lb 12.8 oz (90.2 kg)      Physical Exam HENT:     Head: Normocephalic and atraumatic.     Right Ear: Tympanic membrane, ear canal and external ear normal.     Left Ear: Tympanic membrane, ear canal and external ear normal.     Nose: Nose normal.     Mouth/Throat:     Mouth: Mucous membranes are moist.     Pharynx: Oropharynx is clear.  Eyes:     Conjunctiva/sclera: Conjunctivae normal.  Cardiovascular:     Rate and Rhythm: Normal rate and regular rhythm.  Pulmonary:     Effort: Pulmonary effort is normal.     Breath sounds: Wheezing present. No rhonchi or rales.     Comments: Decreased air movement throughout  Skin:    General: Skin is warm and dry.  Neurological:     General: No focal deficit present.     Mental Status: He is alert. Mental status is at baseline.  Psychiatric:        Mood and Affect: Mood normal.        Behavior: Behavior normal.     No results found for any visits  on 10/07/23.      Assessment & Plan:   Assessment & Plan COPD exacerbation Acute exacerbation likely due to viral infection, presenting with dry cough, wheezing, and exertional dyspnea. Wheezing suggests airway constriction. Previous inhaler use noted. - Prescribed prednisone  twice daily for five days. - Prescribed antibiotic twice daily for seven days. - Provided inhaler for as-needed use.  ED Wants to increase Viagra  to 100 mg PRN.  - doxycycline  (VIBRA -TABS) 100 MG tablet; Take 1 tablet (100 mg total) by mouth 2 (two) times daily for 7 days.  Dispense: 14 tablet; Refill: 0 - albuterol  (VENTOLIN  HFA) 108 (90 Base) MCG/ACT inhaler; Inhale 2 puffs into the lungs every 6 (six) hours as needed for wheezing or shortness of breath.  Dispense: 8 g; Refill: 2 - predniSONE  (DELTASONE ) 20 MG tablet; Take 2 tablets (40 mg  total) by mouth daily for 5 days.  Dispense: 10 tablet; Refill: 0 - sildenafil  (VIAGRA ) 100 MG tablet; Take 0.5-1 tablets (50-100 mg total) by mouth daily as needed for erectile dysfunction.  Dispense: 5 tablet; Refill: 11   Return if symptoms worsen or fail to improve.  Sharyle Fischer, DO

## 2023-10-30 IMAGING — CT CT ANGIO HEAD-NECK (W OR W/O PERF)
2 of 7 series · 8 of 33 positions shown · IV contrast (APPLIED)
Comparison: Noncontrast head CT performed earlier today 05/25/2021

CLINICAL DATA: Provided history: Stroke/TIA, determine embolic
source.

EXAM:
CT ANGIOGRAPHY HEAD AND NECK
TECHNIQUE: Multidetector CT imaging of the head and neck was performed using
the standard protocol during bolus administration of intravenous
contrast. Multiplanar CT image reconstructions and MIPs were
obtained to evaluate the vascular anatomy. Carotid stenosis
measurements (when applicable) are obtained utilizing NASCET
criteria, using the distal internal carotid diameter as the
denominator.

[Series 4: cta neck/head · axial · 0.50mm/px · z∈[-154,-40]mm · 2 of 173 slices shown]
[im 58/173  soft-tissue]
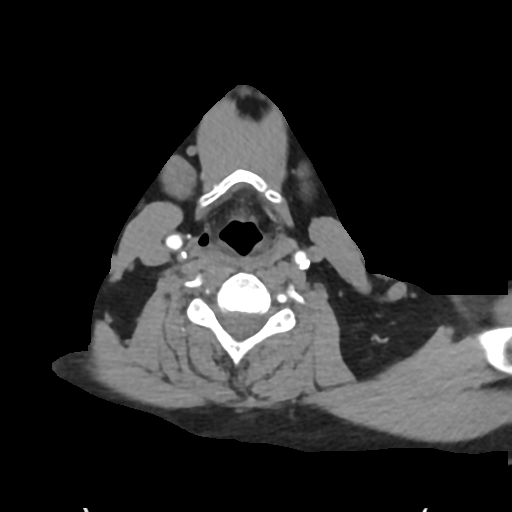
[im 115/173  soft-tissue]
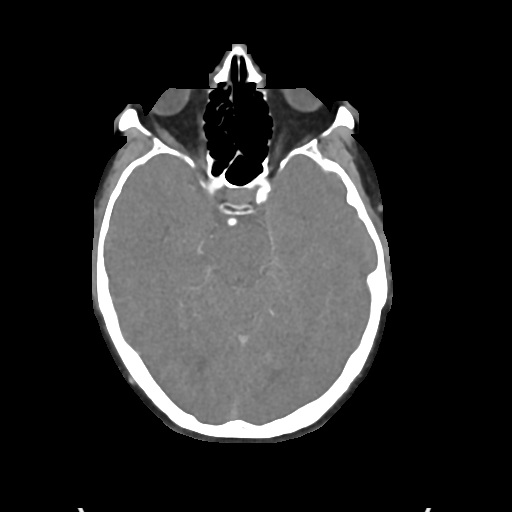

[Series 6: ax thins · axial · 0.42mm/px · z∈[-220,+24]mm · 6 of 343 slices shown]
[im 49/343  soft-tissue]
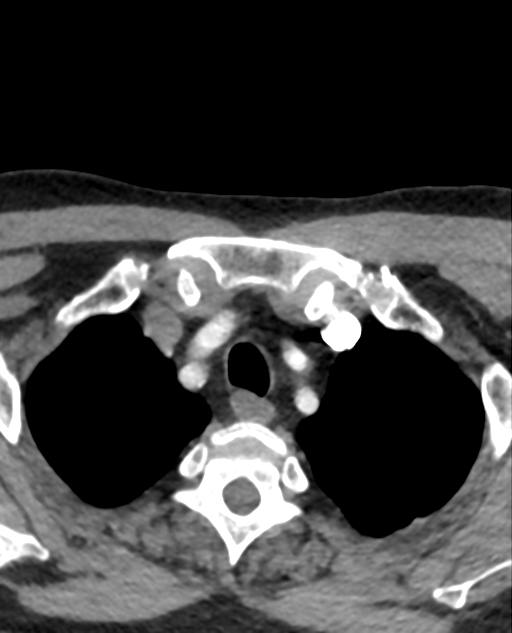
[im 98/343  bone]
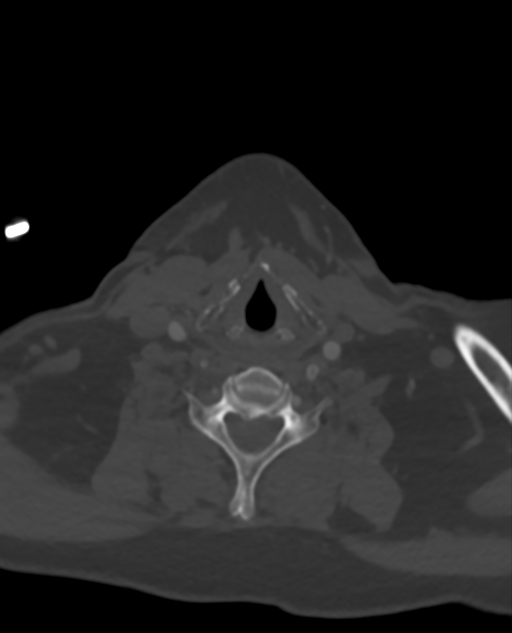
[im 147/343  soft-tissue]
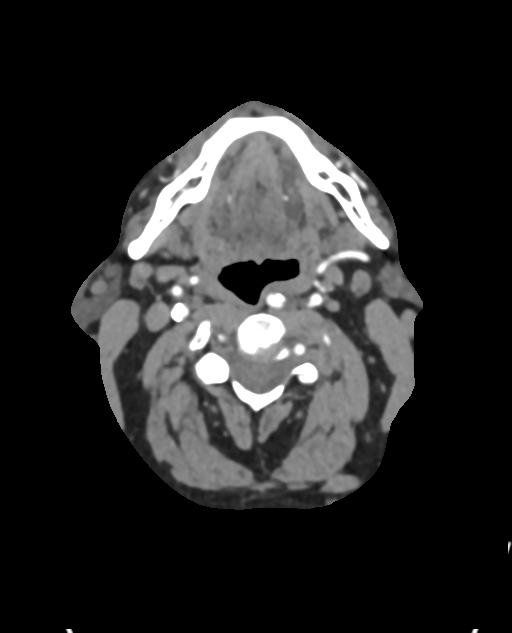
[im 196/343  bone]
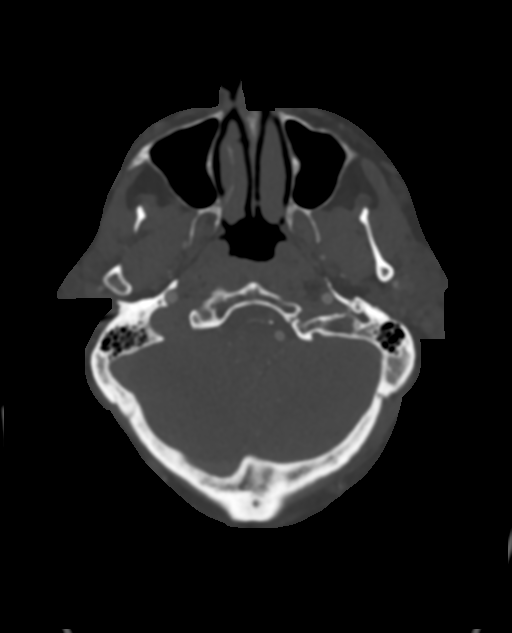
[im 245/343  soft-tissue]
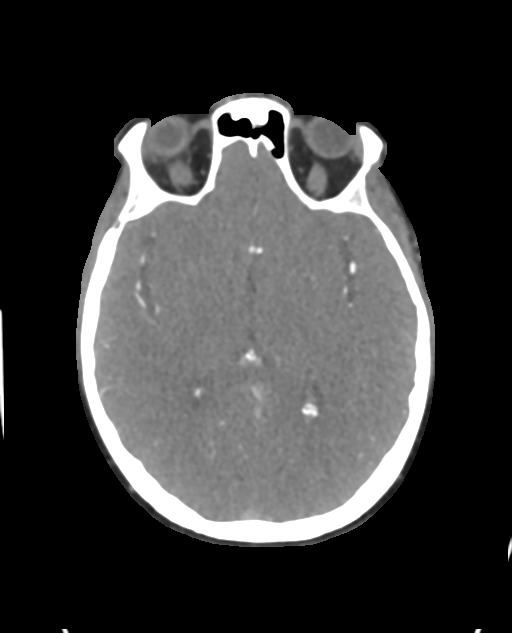
[im 294/343  bone]
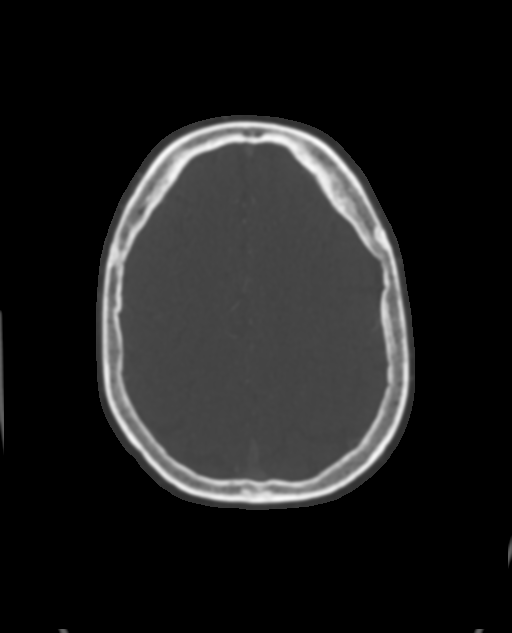

[8 of 33 positions shown; findings below may reference images not displayed]

RADIATION DOSE REDUCTION: This exam was performed according to the
departmental dose-optimization program which includes automated
exposure control, adjustment of the mA and/or kV according to
patient size and/or use of iterative reconstruction technique.

CONTRAST:  75mL OMNIPAQUE IOHEXOL 350 MG/ML SOLN
FINDINGS: CTA NECK FINDINGS

Aortic arch: Standard aortic branching. Atherosclerotic plaque
within the visualized aortic arch and proximal major branch vessels
of the neck. Streak and beam hardening artifact arising from a dense
left-sided contrast bolus partially obscures the left subclavian
artery. Within this limitation, there is no appreciable
hemodynamically significant innominate or proximal subclavian artery
stenosis.

Right carotid system: CCA and ICA patent within the neck.
Atherosclerotic plaque throughout the CCA, about the carotid
bifurcation and within the proximal to mid cervical ICA. Stenosis of
the proximal ICA of up to 40-50%. Tortuosity of the cervical ICA.

Left carotid system: CCA and ICA patent within the neck without
hemodynamically significant stenosis (50% or greater). Mild to
moderate atherosclerotic plaque within the distal CCA, about the
carotid bifurcation and within the proximal ICA. Partially
retropharyngeal course of cervical ICA.

Vertebral arteries: Vertebral arteries patent within the neck. The
left vertebral artery is dominant. Asymmetrically diminished
enhancement of the right vertebral artery, likely due to downstream
occlusion. Significant atherosclerotic irregularity of
atherosclerotic irregularity of the right vertebral artery
throughout the neck. Most notably, there are sites of severe
stenosis within the V1 and V2 segments. The dominant left vertebral
artery is patent within the neck without stenosis. Scattered
nonstenotic calcified plaque throughout this vessel.

Skeleton: Cervical spondylosis, greatest at C5-C6. No appreciable
high-grade spinal canal stenosis. Bilateral C5-C6 bony neural
foraminal narrowing.

Other neck: No neck mass or cervical lymphadenopathy.

Upper chest: No consolidation within the imaged lung apices.
Centrilobular emphysema.

Review of the MIP images confirms the above findings

CTA HEAD FINDINGS

Anterior circulation:

The intracranial internal carotid arteries are patent.
Atherosclerotic plaque within both vessels with no more than mild
stenosis. The M1 middle cerebral arteries are patent.
Atherosclerotic irregularity of the M2 and more distal MCA vessels,
bilaterally. No M2 proximal branch occlusion or high-grade proximal
stenosis. The anterior cerebral arteries are patent. No intracranial
aneurysm is identified.

Posterior circulation:

The non-dominant intracranial right vertebral artery becomes
occluded immediately beyond the right PICA origin. Atherosclerotic
irregularity of the intracranial right vertebral artery more
proximally. The dominant intracranial left vertebral artery is
patent. Mild non-stenotic calcified plaque within this vessel. The
basilar artery is patent. The posterior cerebral arteries are patent
without proximal occlusion or high-grade proximal stenosis. A right
posterior communicating artery is present. The left posterior
communicating artery is diminutive or absent.

Venous sinuses: Within the limitations of contrast timing, no
convincing thrombus.

Anatomic variants: As described.

Review of the MIP images confirms the above findings

CTA head impression #1 called by telephone at the time of
interpretation on 05/25/2021 at [DATE] to provider MAGYAR COOL DACHALA ,
who verbally acknowledged these results.
IMPRESSION: CTA neck:

1. Asymmetrically diminished enhancement of the cervical right
vertebral artery in the setting of downstream intracranial
occlusion. Atherosclerotic disease within both vessels. Most
notably, there are sites of severe stenosis within the V1 and V2
right vertebral artery.
2. The common carotid and internal carotid arteries are patent
within the neck. Atherosclerotic plaque, bilaterally. Up to 40-50%
stenosis of the proximal right ICA. No hemodynamically significant
stenosis within the left CCA or ICA within the neck.
3. Aortic Atherosclerosis (R0OV0-[id number]) and Emphysema ([id number].O).

CTA head:

1. The non-dominant right vertebral artery becomes occluded
immediately beyond the right PICA origin.
2. Additional intracranial atherosclerotic disease, as described. No
other large vessel occlusion or proximal high-grade arterial
stenosis is identified.

## 2023-10-30 IMAGING — CT CT HEAD W/O CM
4 series · 16 of 47 positions shown, 18 images · non-contrast
Comparison: Report from brain MRI 02/18/1998 (images unavailable).

CLINICAL DATA: Provided history: Vision loss, monocular.



[Series 2: head bone · axial · 0.46mm/px · z∈[-158,-126]mm · 3 of 83 slices shown]
[im 9/83  bone]
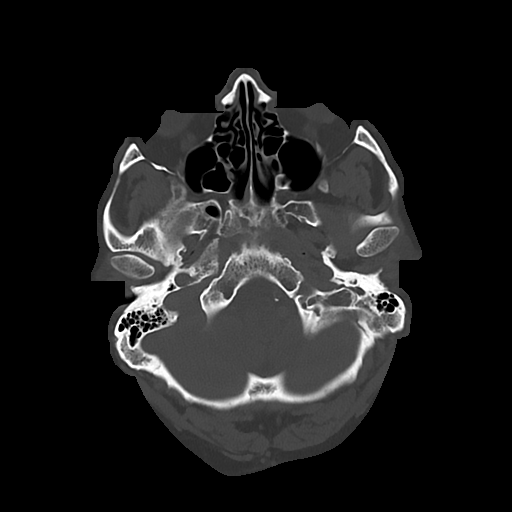
[im 17/83  bone]
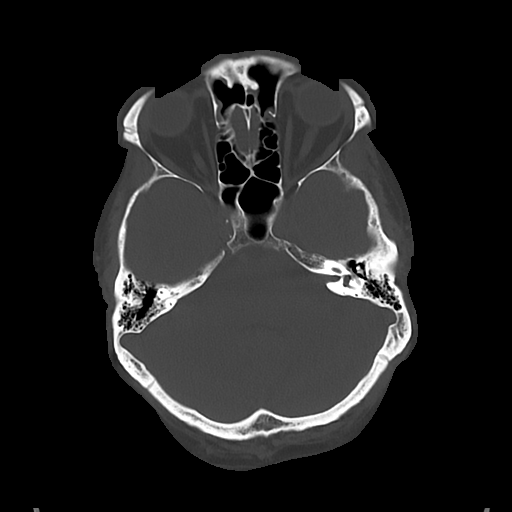
[im 25/83  bone]
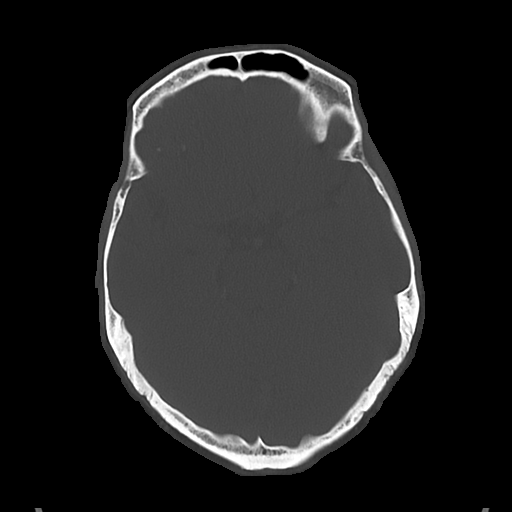

[Series 3: head wo · axial · 0.46mm/px · z∈[-154,-34]mm · 7 of 33 slices shown, 9 images]
[im 5/33  brain]
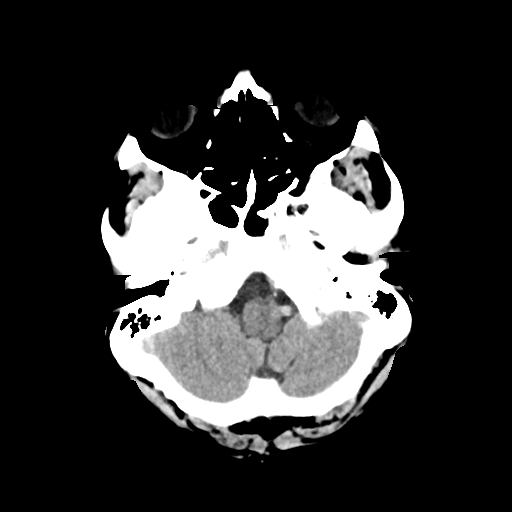
[im 5/33  bone]
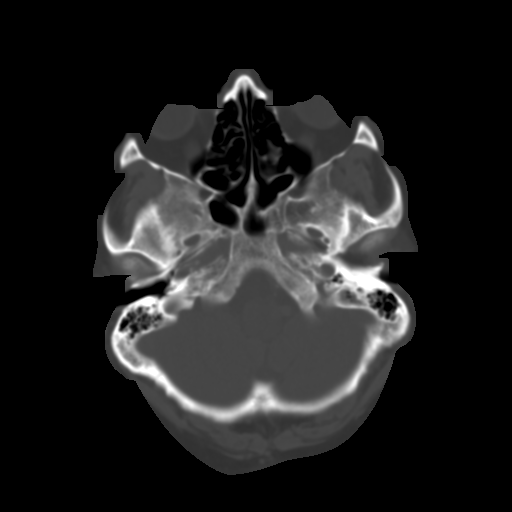
[im 9/33  brain]
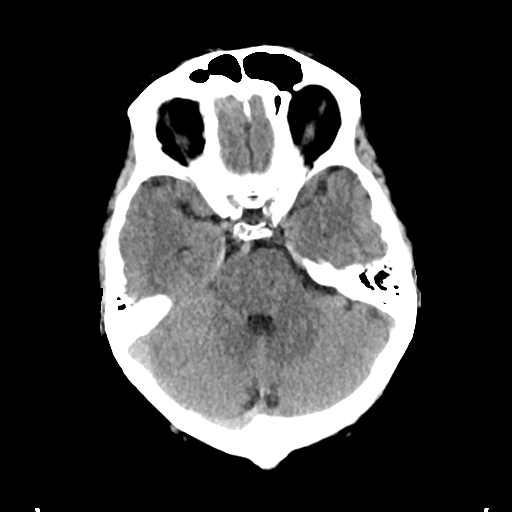
[im 13/33  brain]
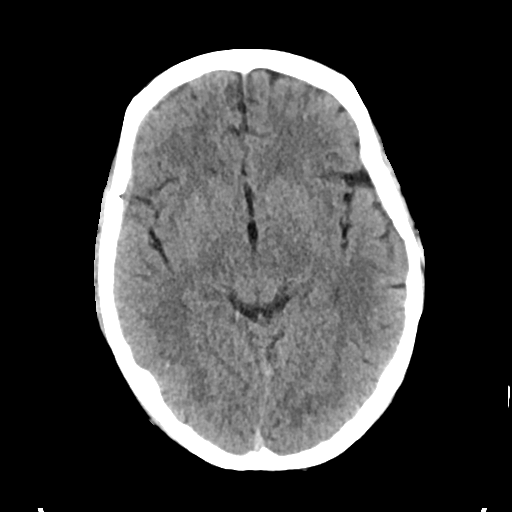
[im 17/33  brain]
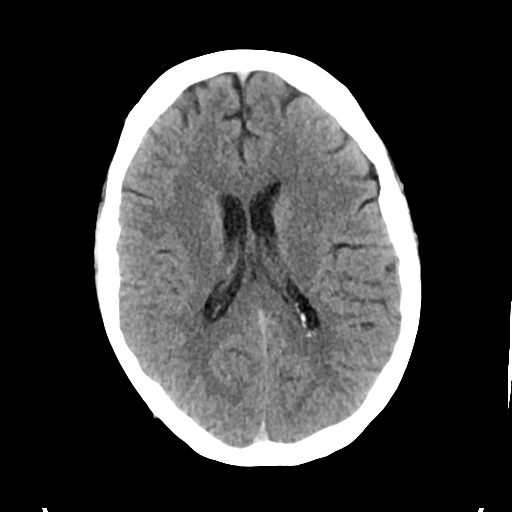
[im 21/33  brain]
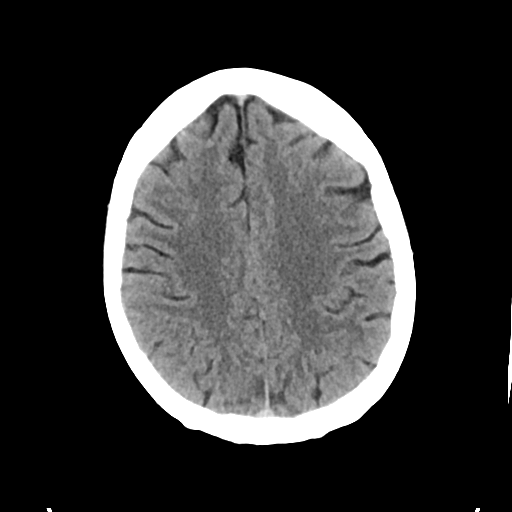
[im 21/33  bone]
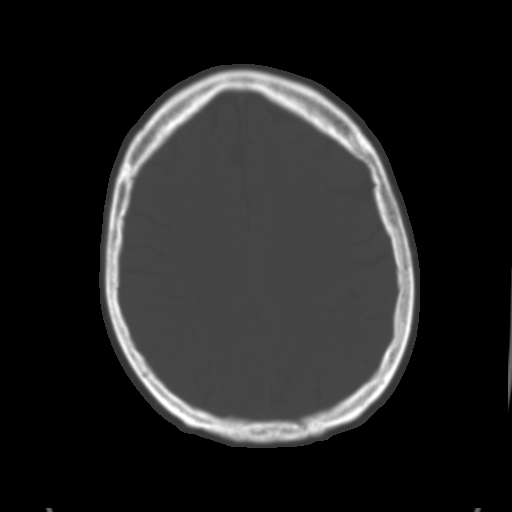
[im 25/33  brain]
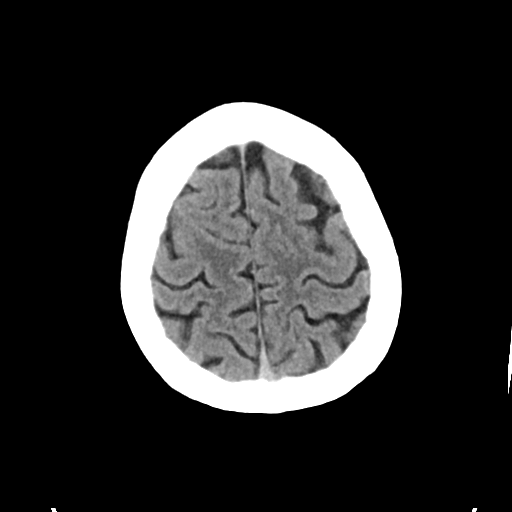
[im 29/33  brain]
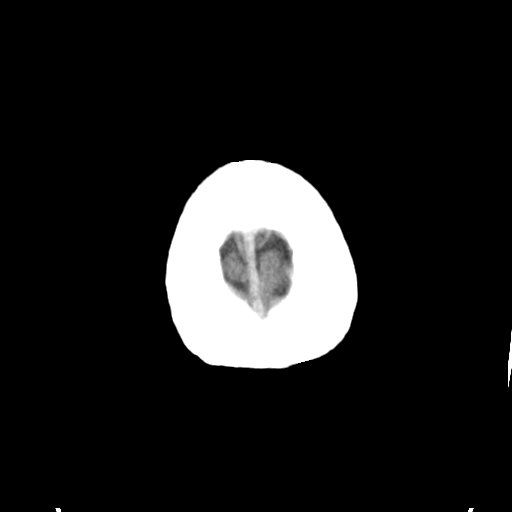

[Series 4: cor soft · coronal · 0.36mm/px · 3 of 69 slices shown]
[im 23/69  brain]
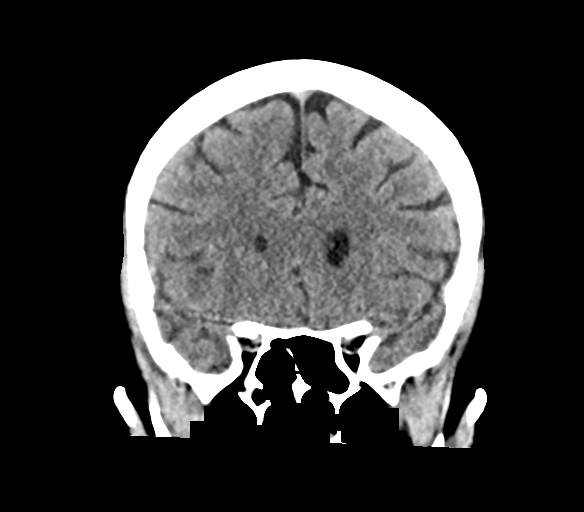
[im 31/69  brain]
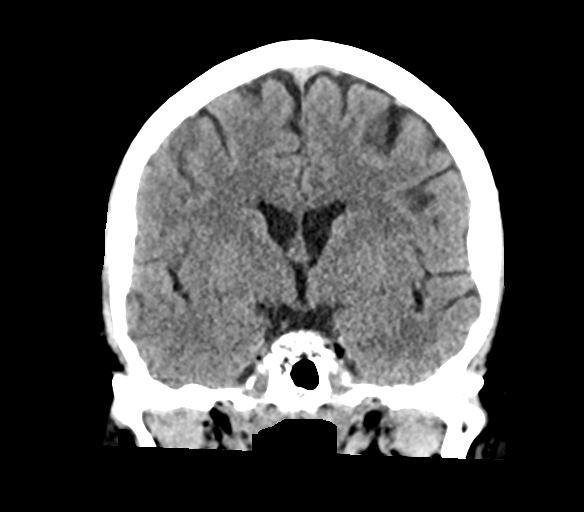
[im 38/69  brain]
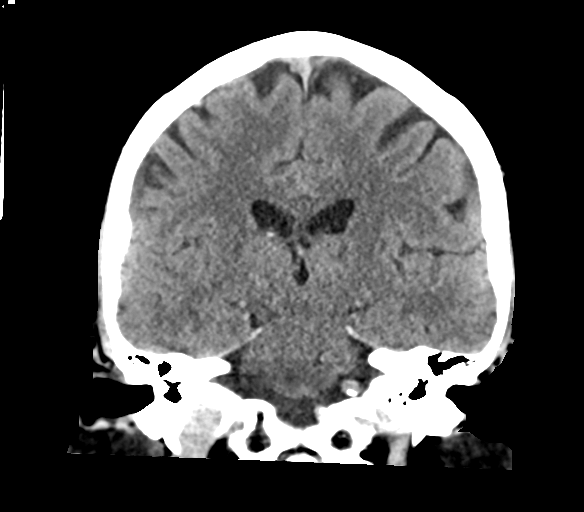

[Series 5: sag soft · sagittal · 0.36mm/px · 3 of 58 slices shown]
[im 20/58  brain]
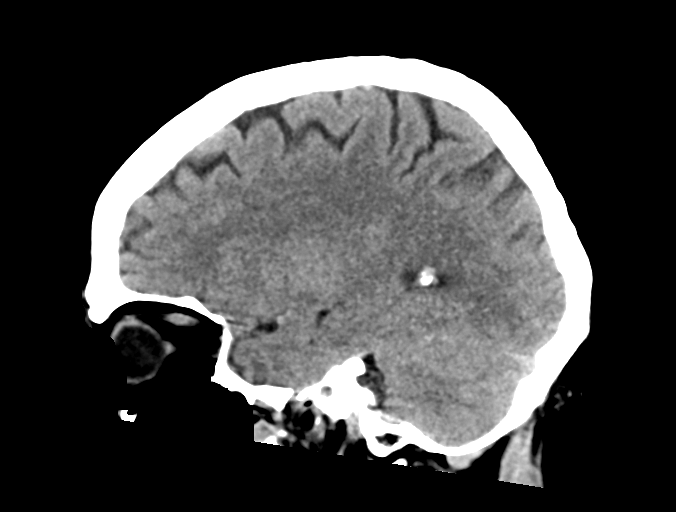
[im 29/58  brain]
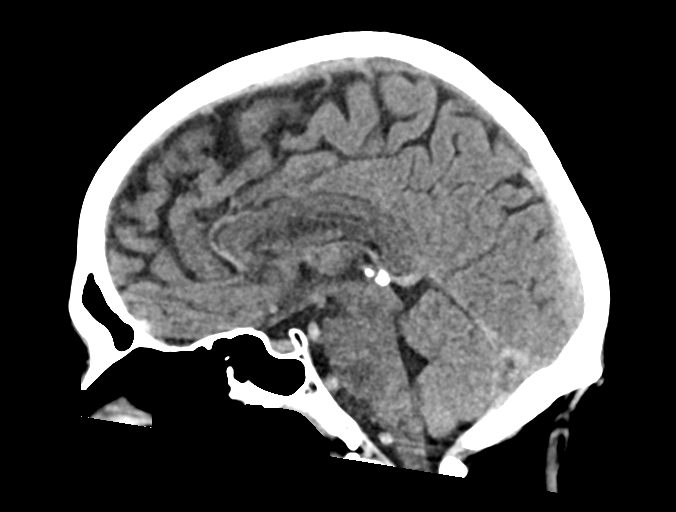
[im 39/58  brain]
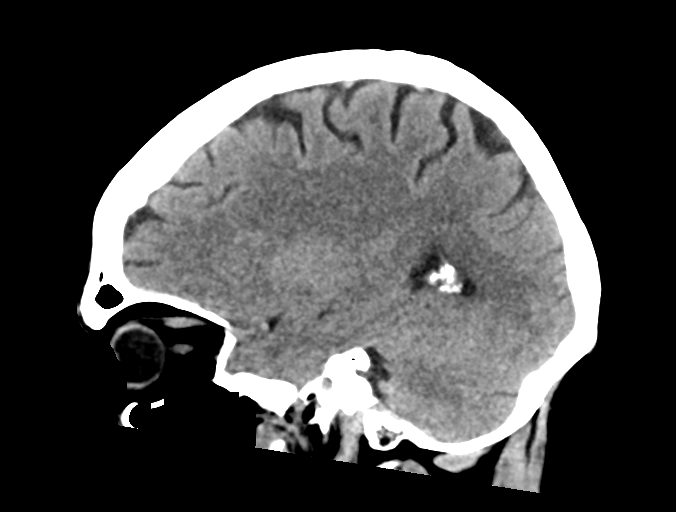

[16 of 47 positions shown; findings below may reference images not displayed]

FINDINGS: Brain:

No age-advanced or lobar predominant parenchymal atrophy.

There is no acute intracranial hemorrhage.

No demarcated cortical infarct.

No extra-axial fluid collection.

No evidence of an intracranial mass.

No midline shift.

Vascular: No hyperdense vessel. Atherosclerotic calcifications.

Skull: Normal. Negative for fracture or focal lesion.

Sinuses/Orbits: No mass or acute finding within the imaged orbits.
Mild mucosal thickening within the bilateral frontal and ethmoid
sinuses.
IMPRESSION: No evidence of acute intracranial abnormality.

Mild mucosal thickening within the bilateral frontal and ethmoid
sinuses.

## 2023-10-30 IMAGING — MR MR HEAD W/O CM
12 series · 48 of 48 positions shown · non-contrast
Comparison: No prior MRI available. Correlation is made with
05/25/2021 CT head

CLINICAL DATA: Partial vision loss in left eye, stroke suspected

EXAM:
MRI HEAD WITHOUT CONTRAST
TECHNIQUE: Multiplanar, multiecho pulse sequences of the brain and surrounding
structures were obtained without intravenous contrast.

[Series 5: ax dwi_tracew · axial · 3.0mm · 0.65mm/px · z∈[-47,+103]mm · 2 of 48 slices shown]
[im 1/48]
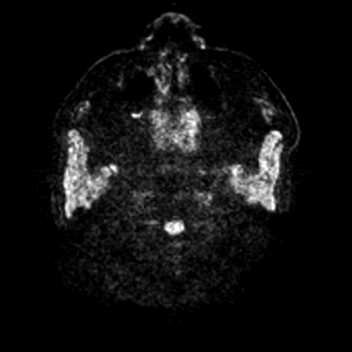
[im 48/48]
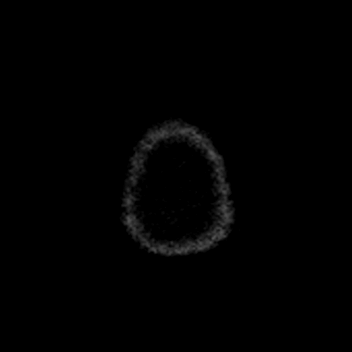

[Series 6: ax dwi_adc · axial · 3.0mm · 0.65mm/px · z∈[-47,+103]mm · 3 of 48 slices shown]
[im 1/48]
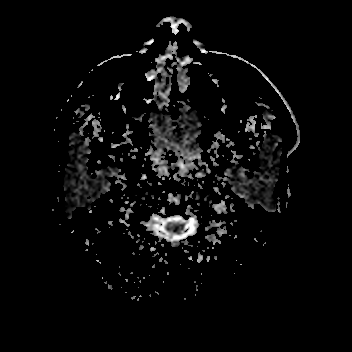
[im 24/48]
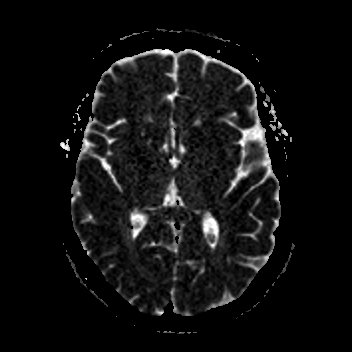
[im 48/48]
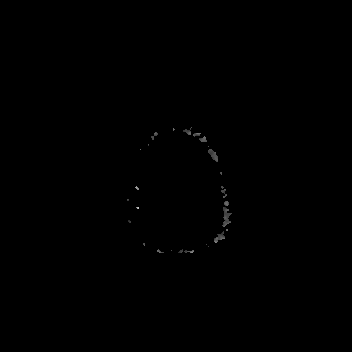

[Series 7: cor dwi_tracew · coronal · 5.0mm · 0.68mm/px · 3 of 40 slices shown]
[im 1/40]
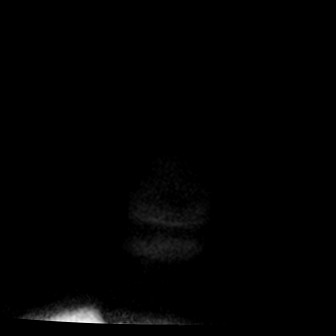
[im 20/40]
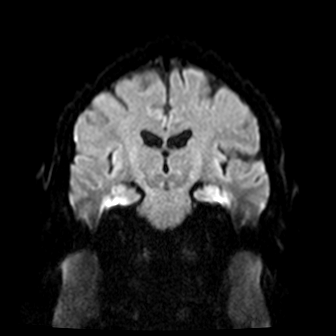
[im 40/40]
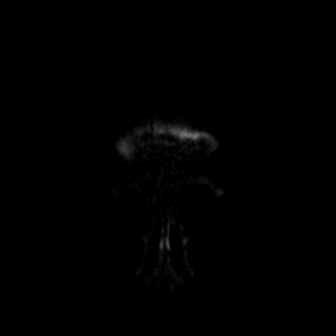

[Series 8: cor dwi_adc · coronal · 5.0mm · 0.68mm/px · 3 of 39 slices shown]
[im 1/39]
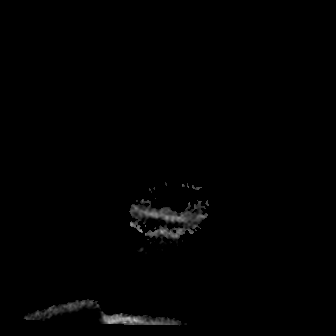
[im 20/39]
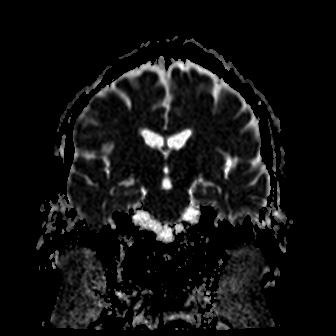
[im 39/39]
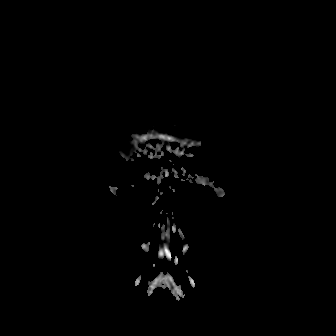

[Series 9: T1 · sagittal · 5.0mm · 0.62mm/px · 2 of 25 slices shown (1 of 2)]
[im 1/25]
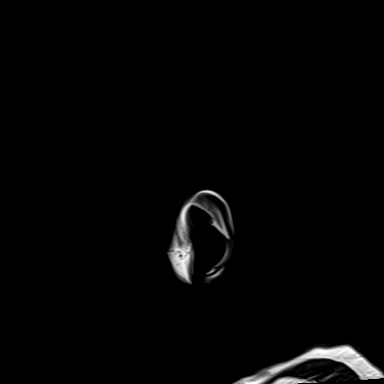
[im 25/25]
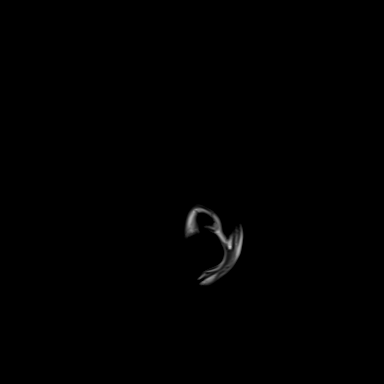

[Series 10: T2 · axial · 5.0mm · 0.53mm/px · z∈[-49,+103]mm · 2 of 27 slices shown (1 of 2)]
[im 1/27]
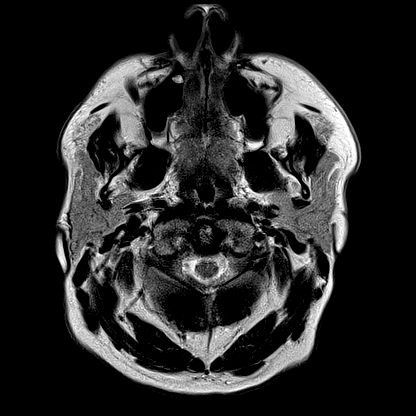
[im 27/27]
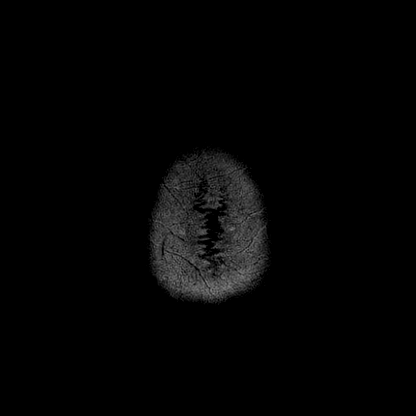

[Series 11: ax swi_mag · axial · 2.0mm · 0.90mm/px · z∈[-48,+105]mm · 5 of 80 slices shown]
[im 1/80]
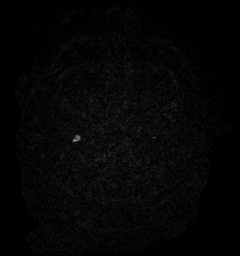
[im 20/80]
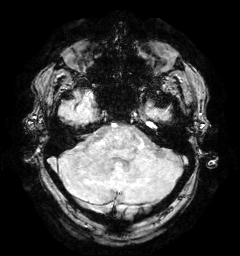
[im 40/80]
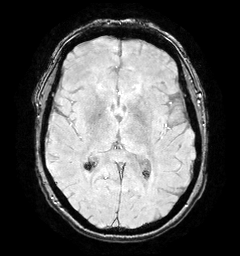
[im 60/80]
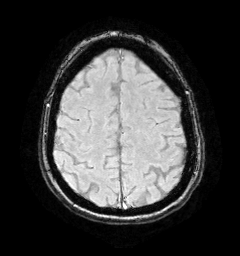
[im 80/80]
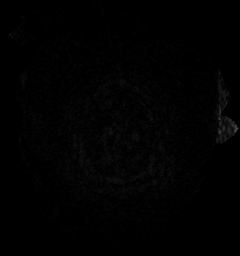

[Series 12: ax swi_pha · axial · 2.0mm · 0.90mm/px · z∈[-48,+105]mm · 5 of 80 slices shown]
[im 1/80]
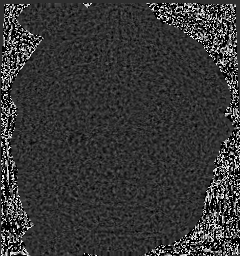
[im 20/80]
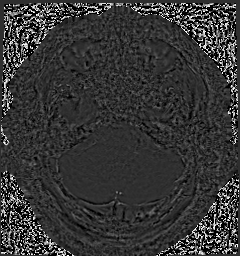
[im 40/80]
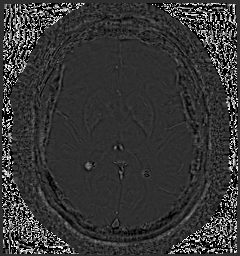
[im 60/80]
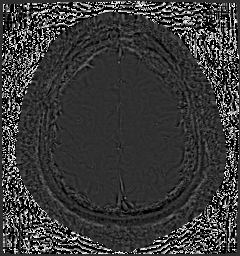
[im 80/80]
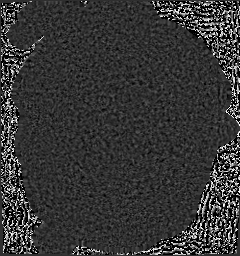

[Series 13: ax swi_swi · axial · 2.0mm · 0.90mm/px · z∈[-48,+105]mm · 5 of 80 slices shown]
[im 1/80]
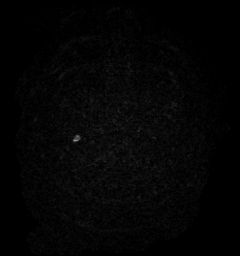
[im 20/80]
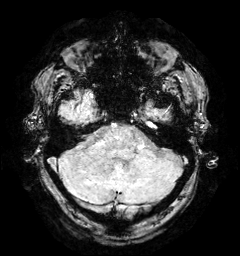
[im 40/80]
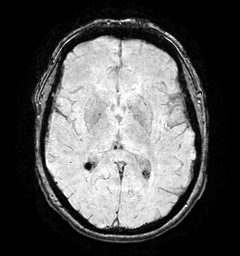
[im 60/80]
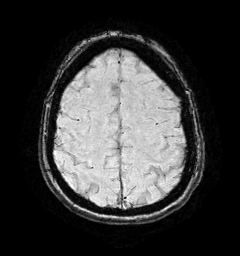
[im 80/80]
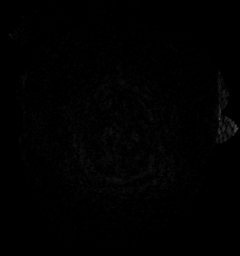

[Series 15: FLAIR · axial · 3.0mm · 0.53mm/px · z∈[-52,+106]mm · 4 of 55 slices shown]
[im 1/55]
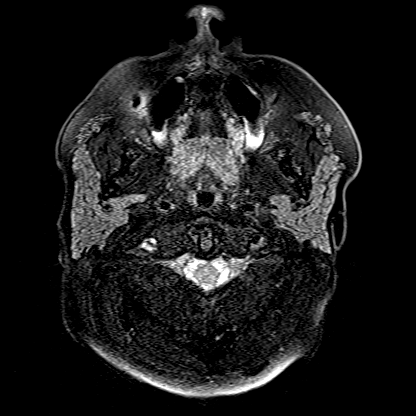
[im 19/55]
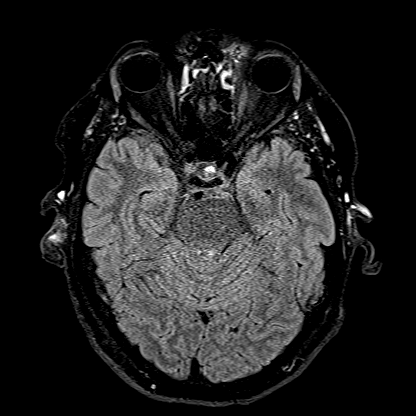
[im 37/55]
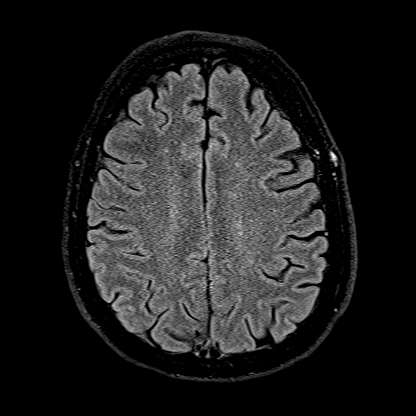
[im 55/55]
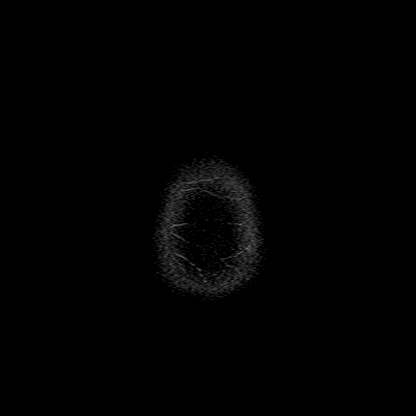

[Series 16: T1 · axial · 1.0mm · 0.98mm/px · z∈[-48,+122]mm · 12 of 175 slices shown (2 of 2)]
[im 1/175]
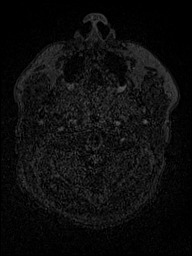
[im 16/175]
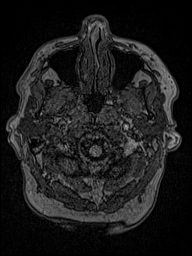
[im 32/175]
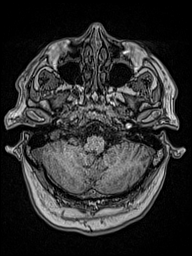
[im 48/175]
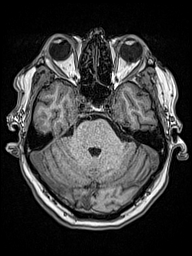
[im 64/175]
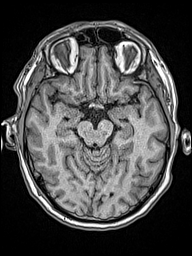
[im 80/175]
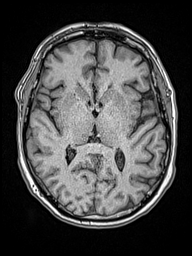
[im 95/175]
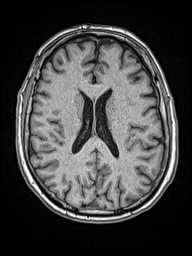
[im 111/175]
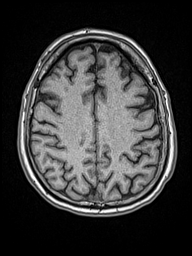
[im 127/175]
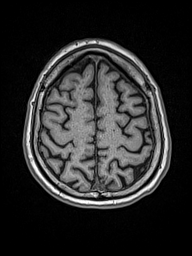
[im 143/175]
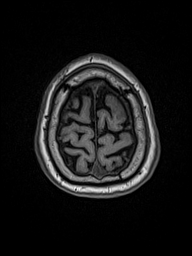
[im 159/175]
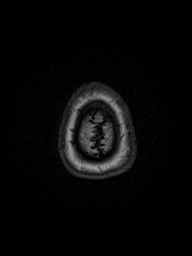
[im 175/175]
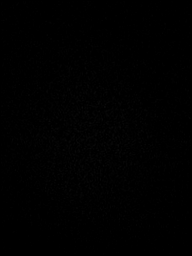

[Series 17: T2 · coronal · 5.0mm · 0.57mm/px · 2 of 29 slices shown (2 of 2)]
[im 1/29]
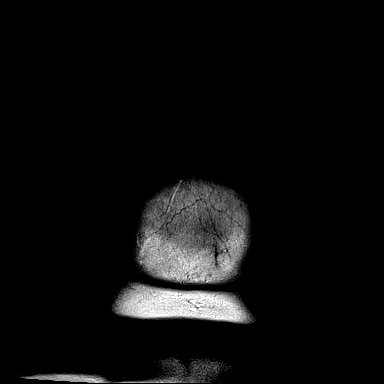
[im 29/29]
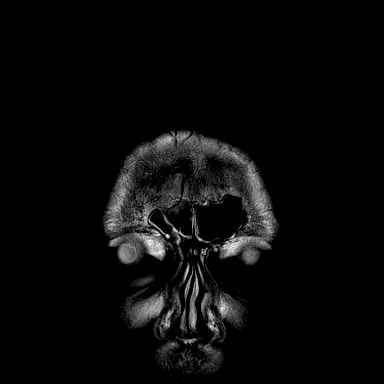

[48 of 48 positions shown; findings below may reference images not displayed]

FINDINGS: Brain: No restricted diffusion to suggest acute or subacute infarct.
No acute hemorrhage, mass, mass effect, or midline shift. No
hydrocephalus or extra-axial collection. Scattered T2 hyperintense
signal in the periventricular white matter, likely the sequela of
mild chronic small vessel ischemic disease. Lacunar infarct in the
left thalamus.

Vascular: Normal arterial flow voids.

Skull and upper cervical spine: Normal marrow signal.

Sinuses/Orbits: The orbits are unremarkable. Mild mucosal thickening
in the ethmoid air cells and frontal sinuses.

Other: The mastoids are well aerated.
IMPRESSION: No acute intracranial process. No evidence of acute or subacute
infarct.

## 2023-11-18 ENCOUNTER — Other Ambulatory Visit: Payer: Self-pay | Admitting: Internal Medicine

## 2023-11-18 DIAGNOSIS — E7800 Pure hypercholesterolemia, unspecified: Secondary | ICD-10-CM

## 2023-11-21 NOTE — Telephone Encounter (Signed)
 Requested Prescriptions  Pending Prescriptions Disp Refills   rosuvastatin  (CRESTOR ) 40 MG tablet [Pharmacy Med Name: ROSUVASTATIN  CALCIUM  40 MG TAB] 90 tablet 0    Sig: TAKE 1 TABLET BY MOUTH EVERY DAY     Cardiovascular:  Antilipid - Statins 2 Failed - 11/21/2023 12:17 PM      Failed - Lipid Panel in normal range within the last 12 months    Cholesterol, Total  Date Value Ref Range Status  06/09/2015 185 100 - 199 mg/dL Final   Cholesterol  Date Value Ref Range Status  05/26/2023 165 <200 mg/dL Final   LDL Cholesterol (Calc)  Date Value Ref Range Status  05/26/2023 76 mg/dL (calc) Final    Comment:    Reference range: <100 . Desirable range <100 mg/dL for primary prevention;   <70 mg/dL for patients with CHD or diabetic patients  with > or = 2 CHD risk factors. SABRA LDL-C is now calculated using the Martin-Hopkins  calculation, which is a validated novel method providing  better accuracy than the Friedewald equation in the  estimation of LDL-C.  Gladis APPLETHWAITE et al. SANDREA. 7986;689(80): 2061-2068  (http://education.[facility].com/faq/FAQ164)    HDL  Date Value Ref Range Status  05/26/2023 64 > OR = 40 mg/dL Final  94/77/7982 57 >60 mg/dL Final   Triglycerides  Date Value Ref Range Status  05/26/2023 156 (H) <150 mg/dL Final         Passed - Cr in normal range and within 360 days    Creat  Date Value Ref Range Status  05/26/2023 0.83 0.70 - 1.35 mg/dL Final   Creatinine, Urine  Date Value Ref Range Status  02/23/2023 237 20 - 320 mg/dL Final         Passed - Patient is not pregnant      Passed - Valid encounter within last 12 months    Recent Outpatient Visits           1 month ago Chronic obstructive pulmonary disease with acute exacerbation [facility])   [place] [facility] Bernardo Fend, [place]   5 months ago Primary hypertension   [facility] Bernardo Fend, DO   9 months ago Primary hypertension   [facility] Bernardo Fend, [place]

## 2023-12-01 ENCOUNTER — Encounter: Payer: Self-pay | Admitting: Internal Medicine

## 2023-12-01 ENCOUNTER — Other Ambulatory Visit: Payer: Self-pay

## 2023-12-01 ENCOUNTER — Ambulatory Visit: Admitting: Internal Medicine

## 2023-12-01 VITALS — BP 148/82 | HR 93 | Temp 98.2°F | Resp 18 | Ht 67.0 in | Wt 201.5 lb

## 2023-12-01 DIAGNOSIS — R051 Acute cough: Secondary | ICD-10-CM | POA: Diagnosis not present

## 2023-12-01 DIAGNOSIS — J441 Chronic obstructive pulmonary disease with (acute) exacerbation: Secondary | ICD-10-CM | POA: Diagnosis not present

## 2023-12-01 LAB — POC COVID19/FLU A&B COMBO
Covid Antigen, POC: NEGATIVE
Influenza A Antigen, POC: NEGATIVE
Influenza B Antigen, POC: NEGATIVE

## 2023-12-01 MED ORDER — DOXYCYCLINE HYCLATE 100 MG PO TABS: 100.0000 mg | ORAL_TABLET | Freq: Two times a day (BID) | ORAL | 0 refills | Status: AC

## 2023-12-01 MED ORDER — BENZONATATE 100 MG PO CAPS: 100.0000 mg | ORAL_CAPSULE | Freq: Two times a day (BID) | ORAL | 0 refills | Status: AC | PRN

## 2023-12-01 MED ORDER — PREDNISONE 20 MG PO TABS: 40.0000 mg | ORAL_TABLET | Freq: Every day | ORAL | 0 refills | Status: AC

## 2023-12-01 NOTE — Progress Notes (Signed)
 Acute Office Visit  Subjective:     Patient ID: ZIGMUND LINSE, male    DOB: 1960-05-21, 63 y.o.   MRN: [id number]  Chief Complaint  Patient presents with   URI    Cough, congestion for 5 days    URI  Associated symptoms include congestion and coughing. Pertinent negatives include no sinus pain, sore throat or wheezing.   Patient is in today for cough and congestion x 5 days.   Discussed the use of AI scribe software for clinical note transcription with the patient, who gave verbal consent to proceed.  History of Present Illness JUSTON GOHEEN is a 63 year old male who presents with recurrent cough and sinus infection after quitting smoking.  He quit smoking 49 days ago after a 40-year history of smoking. Initially, breathing improved and coughing reduced. Recently, he developed a cough productive of green sputum, which began on Sunday morning. No fever is present, and his nose is clear. Previous treatment with steroids and antibiotics significantly improved his symptoms, allowing better breathing and increased energy. He is amazed at the improvement in his sense of smell and overall respiratory function since quitting smoking. He has not experienced any backsliding in his smoking cessation efforts, despite being around others who smoke. Prior to quitting smoking, he struggled with activities such as taking out the trash due to shortness of breath, but now he no longer experiences this issue. He is semi-retired, engages in small jobs, and wishes to maintain his independence and activity level for several more years.   Review of Systems  Constitutional:  Negative for chills and fever.  HENT:  Positive for congestion. Negative for sinus pain and sore throat.   Respiratory:  Positive for cough and sputum production. Negative for shortness of breath and wheezing.         Objective:    BP (!) 148/82 (Cuff Size: Large)   Pulse 93   Temp 98.2 F (36.8 C) (Oral)   Resp 18   Ht 5'  7 (1.702 m)   Wt 201 lb 8 oz (91.4 kg)   SpO2 93%   BMI 31.56 kg/m    Physical Exam Constitutional:      Appearance: Normal appearance.  HENT:     Head: Normocephalic and atraumatic.     Right Ear: Tympanic membrane, ear canal and external ear normal.     Left Ear: Tympanic membrane, ear canal and external ear normal.     Nose: Congestion present.     Mouth/Throat:     Mouth: Mucous membranes are moist.     Pharynx: Oropharynx is clear.  Cardiovascular:     Rate and Rhythm: Normal rate and regular rhythm.  Pulmonary:     Effort: Pulmonary effort is normal.     Breath sounds: No wheezing, rhonchi or rales.     Comments: Decreased air sounds throughout  Skin:    General: Skin is warm and dry.  Neurological:     General: No focal deficit present.     Mental Status: He is alert. Mental status is at baseline.  Psychiatric:        Mood and Affect: Mood normal.        Behavior: Behavior normal.     Results for orders placed or performed in visit on 12/01/23  POC Covid19/Flu A&B Antigen  Result Value Ref Range   Influenza A Antigen, POC Negative Negative   Influenza B Antigen, POC Negative Negative   Covid Antigen, POC  Negative Negative        Assessment & Plan:   Assessment & Plan Chronic obstructive pulmonary disease with acute exacerbation COPD exacerbation likely due to viral illness. Flu and COVID test negative. Breathing improved post-smoking cessation and steroid use. Productive cough with green sputum, no fever or nasal congestion. Lungs clear, no pneumonia. No chest x-ray needed. - Prescribed antibiotics twice daily for seven days. - Prescribed steroids as previously effective.  Acute cough Productive cough with green sputum, related to COPD exacerbation. No nasal congestion or fever. Cough reduced post-smoking cessation and treatment. - Prescribed antibiotics ([facility]) twice daily for seven days. - Prescribed steroids as previously effective. - Cough  medication sent to pharmacy.   - POC Covid19/Flu A&B Antigen - benzonatate  (TESSALON ) 100 MG capsule; Take 1 capsule (100 mg total) by mouth 2 (two) times daily as needed for cough.  Dispense: 20 capsule; Refill: 0 - predniSONE  (DELTASONE ) 20 MG tablet; Take 2 tablets (40 mg total) by mouth daily with breakfast for 5 days.  Dispense: 10 tablet; Refill: 0 - doxycycline  (VIBRA -TABS) 100 MG tablet; Take 1 tablet (100 mg total) by mouth 2 (two) times daily for 7 days.  Dispense: 14 tablet; Refill: 0   Return if symptoms worsen or fail to improve.  Sharyle Fischer, DO

## 2023-12-22 ENCOUNTER — Other Ambulatory Visit: Payer: Self-pay | Admitting: Internal Medicine

## 2023-12-22 DIAGNOSIS — I10 Essential (primary) hypertension: Secondary | ICD-10-CM

## 2023-12-22 DIAGNOSIS — K219 Gastro-esophageal reflux disease without esophagitis: Secondary | ICD-10-CM

## 2023-12-24 NOTE — Telephone Encounter (Signed)
 Requested Prescriptions  Pending Prescriptions Disp Refills   famotidine  (PEPCID ) 20 MG tablet [Pharmacy Med Name: FAMOTIDINE  20 MG TABLET] 90 tablet 1    Sig: TAKE 1 TABLET (20 MG TOTAL) BY MOUTH DAILY AS NEEDED FOR HEARTBURN OR INDIGESTION.     Gastroenterology:  H2 Antagonists Passed - 12/24/2023 10:03 AM      Passed - Valid encounter within last 12 months    Recent Outpatient Visits           3 weeks ago Chronic obstructive pulmonary disease with acute exacerbation [facility])   [place] [facility] Bernardo Fend, DO   2 months ago Chronic obstructive pulmonary disease with acute exacerbation [facility])   [place] [facility] Bernardo Fend, DO   7 months ago Primary hypertension   [place] [facility] Bernardo Fend, DO   10 months ago Primary hypertension   [place] [facility] Bernardo Fend, DO               amLODipine  (NORVASC ) 10 MG tablet [Pharmacy Med Name: AMLODIPINE  BESYLATE 10 MG TAB] 90 tablet 1    Sig: TAKE 1 TABLET BY MOUTH EVERY DAY     Cardiovascular: Calcium  Channel Blockers 2 Failed - 12/24/2023 10:03 AM      Failed - Last BP in normal range    BP Readings from Last 1 Encounters:  12/01/23 (!) 148/82         Passed - Last Heart Rate in normal range    Pulse Readings from Last 1 Encounters:  12/01/23 93         Passed - Valid encounter within last 6 months    Recent Outpatient Visits           3 weeks ago Chronic obstructive pulmonary disease with acute exacerbation [facility])   [place] [facility] Bernardo Fend, DO   2 months ago Chronic obstructive pulmonary disease with acute exacerbation [facility])   [place] [facility] Bernardo Fend, DO   7 months ago Primary hypertension   [place] [facility] Bernardo Fend, DO   10 months ago Primary hypertension   [facility] Bernardo Fend, [place]

## 2024-03-08 ENCOUNTER — Ambulatory Visit: Admitting: Internal Medicine
